# Patient Record
Sex: Female | Born: 1990 | Hispanic: Yes | Marital: Married | Smoking: Never smoker
Health system: Southern US, Community
[De-identification: ages and names within clinical notes are randomized; demographics above are authoritative.]

## PROBLEM LIST (undated history)

## (undated) DIAGNOSIS — K2970 Gastritis, unspecified, without bleeding: Secondary | ICD-10-CM

## (undated) DIAGNOSIS — O139 Gestational [pregnancy-induced] hypertension without significant proteinuria, unspecified trimester: Secondary | ICD-10-CM

## (undated) DIAGNOSIS — K219 Gastro-esophageal reflux disease without esophagitis: Secondary | ICD-10-CM

## (undated) DIAGNOSIS — O24419 Gestational diabetes mellitus in pregnancy, unspecified control: Secondary | ICD-10-CM

## (undated) HISTORY — DX: Gestational diabetes mellitus in pregnancy, unspecified control: O24.419

## (undated) HISTORY — PX: NO PAST SURGERIES: SHX2092

## (undated) HISTORY — DX: Gestational (pregnancy-induced) hypertension without significant proteinuria, unspecified trimester: O13.9

---

## 2018-12-20 ENCOUNTER — Emergency Department (HOSPITAL_COMMUNITY): Payer: Self-pay

## 2018-12-20 ENCOUNTER — Emergency Department (HOSPITAL_COMMUNITY)
Admission: EM | Admit: 2018-12-20 | Discharge: 2018-12-20 | Disposition: A | Discharge status: Home / Self Care | Payer: Self-pay | Attending: Emergency Medicine | Admitting: Emergency Medicine

## 2018-12-20 ENCOUNTER — Encounter (HOSPITAL_COMMUNITY): Payer: Self-pay

## 2018-12-20 ENCOUNTER — Other Ambulatory Visit: Payer: Self-pay

## 2018-12-20 DIAGNOSIS — R1013 Epigastric pain: Secondary | ICD-10-CM | POA: Insufficient documentation

## 2018-12-20 LAB — COMPREHENSIVE METABOLIC PANEL
ALT: 89 U/L — ABNORMAL HIGH (ref 0–44)
AST: 134 U/L — ABNORMAL HIGH (ref 15–41)
Albumin: 3.9 g/dL (ref 3.5–5.0)
Alkaline Phosphatase: 70 U/L (ref 38–126)
Anion gap: 10 (ref 5–15)
BUN: 12 mg/dL (ref 6–20)
CO2: 25 mmol/L (ref 22–32)
Calcium: 9 mg/dL (ref 8.9–10.3)
Chloride: 101 mmol/L (ref 98–111)
Creatinine, Ser: 0.69 mg/dL (ref 0.44–1.00)
GFR calc Af Amer: >60 mL/min (ref >60)
GFR calc non Af Amer: >60 mL/min (ref >60)
Glucose, Bld: 138 mg/dL — ABNORMAL HIGH (ref 70–99)
Potassium: 3.8 mmol/L (ref 3.5–5.1)
Sodium: 136 mmol/L (ref 135–145)
Total Bilirubin: 0.6 mg/dL (ref 0.3–1.2)
Total Protein: 7.7 g/dL (ref 6.5–8.1)

## 2018-12-20 LAB — URINALYSIS, ROUTINE W REFLEX MICROSCOPIC
Bacteria, UA: NONE SEEN
Bilirubin Urine: NEGATIVE
Glucose, UA: NEGATIVE mg/dL
Hgb urine dipstick: NEGATIVE
Ketones, ur: NEGATIVE mg/dL
Leukocytes,Ua: NEGATIVE
Nitrite: NEGATIVE
Protein, ur: 30 mg/dL — AB
Specific Gravity, Urine: 1.026 (ref 1.005–1.030)
pH: 7 (ref 5.0–8.0)

## 2018-12-20 LAB — CBC
HCT: 36.3 % (ref 36.0–46.0)
Hemoglobin: 12.5 g/dL (ref 12.0–15.0)
MCH: 29.8 pg (ref 26.0–34.0)
MCHC: 34.4 g/dL (ref 30.0–36.0)
MCV: 86.4 fL (ref 80.0–100.0)
Platelets: 344 10*3/uL (ref 150–400)
RBC: 4.2 MIL/uL (ref 3.87–5.11)
RDW: 13.2 % (ref 11.5–15.5)
WBC: 13.2 10*3/uL — ABNORMAL HIGH (ref 4.0–10.5)
nRBC: 0 % (ref 0.0–0.2)

## 2018-12-20 LAB — LIPASE, BLOOD: Lipase: 31 U/L (ref 11–51)

## 2018-12-20 LAB — I-STAT BETA HCG BLOOD, ED (MC, WL, AP ONLY): I-stat hCG, quantitative: <5 m[IU]/mL (ref <5)

## 2018-12-20 NOTE — ED Notes (Signed)
Patient Alert and oriented to baseline. Stable and ambulatory to baseline. Patient verbalized understanding of the discharge instructions.  Patient belongings were taken by the patient.   

## 2018-12-20 NOTE — ED Notes (Signed)
Pt is hispanic she does understand english

## 2018-12-20 NOTE — ED Triage Notes (Signed)
Pt endorses abd pain "for a while especially when I eat spicy food" with 1 episode of n/v today. Unknown LMP. VSS

## 2018-12-20 NOTE — ED Provider Notes (Signed)
Care assumed at shift change from venter, PA-C, pending RUQ U/S. See her note for full HPI and workup. Briefly, pt presenting with longstanding hx of epigastric pain, [place]. Pt uses prilosec prn. Burning pain with nausea today. Denies EtOH. Pending RUQ u/s for possible cholecystits vs cholelithiasis. If neg, d/c with symptomatic for GERD and PCP referral.  Physical Exam  BP 120/77   Pulse 80   Temp 98.1 F (36.7 C) (Oral)   Resp 16   LMP 11/18/2018 (Exact Date)   SpO2 100%   Physical Exam Vitals signs and nursing note reviewed.  Constitutional:      General: She is not in acute distress.    Appearance: She is well-developed.  HENT:     Head: Normocephalic and atraumatic.  Eyes:     Conjunctiva/sclera: Conjunctivae normal.  Pulmonary:     Effort: Pulmonary effort is normal.  Neurological:     Mental Status: She is alert.  Psychiatric:        Mood and Affect: Mood normal.        Behavior: Behavior normal.    Results for orders placed or performed during the hospital encounter of 12/20/18  Lipase, blood  Result Value Ref Range   Lipase 31 11 - 51 U/L  Comprehensive metabolic panel  Result Value Ref Range   Sodium 136 135 - 145 mmol/L   Potassium 3.8 3.5 - 5.1 mmol/L   Chloride 101 98 - 111 mmol/L   CO2 25 22 - 32 mmol/L   Glucose, Bld 138 (H) 70 - 99 mg/dL   BUN 12 6 - 20 mg/dL   Creatinine, Ser 9.600.69 0.44 - 1.00 mg/dL   Calcium 9.0 8.9 - 45.410.3 mg/dL   Total Protein 7.7 6.5 - 8.1 g/dL   Albumin 3.9 3.5 - 5.0 g/dL   AST 098134 (H) 15 - 41 U/L   ALT 89 (H) 0 - 44 U/L   Alkaline Phosphatase 70 38 - 126 U/L   Total Bilirubin 0.6 0.3 - 1.2 mg/dL   GFR calc non Af Amer >60 >60 mL/min   GFR calc Af Amer >60 >60 mL/min   Anion gap 10 5 - 15  CBC  Result Value Ref Range   WBC 13.2 (H) 4.0 - 10.5 K/uL   RBC 4.20 3.87 - 5.11 MIL/uL   Hemoglobin 12.5 12.0 - 15.0 g/dL   HCT 11.936.3 14.736.0 - 82.946.0 %   MCV 86.4 80.0 - 100.0 fL   MCH 29.8 26.0 - 34.0 pg   MCHC  34.4 30.0 - 36.0 g/dL   RDW 56.213.2 13.011.5 - 86.515.5 %   Platelets 344 150 - 400 K/uL   nRBC 0.0 0.0 - 0.2 %  Urinalysis, Routine w reflex microscopic  Result Value Ref Range   Color, Urine YELLOW YELLOW   APPearance CLOUDY (A) CLEAR   Specific Gravity, Urine 1.026 1.005 - 1.030   pH 7.0 5.0 - 8.0   Glucose, UA NEGATIVE NEGATIVE mg/dL   Hgb urine dipstick NEGATIVE NEGATIVE   Bilirubin Urine NEGATIVE NEGATIVE   Ketones, ur NEGATIVE NEGATIVE mg/dL   Protein, ur 30 (A) NEGATIVE mg/dL   Nitrite NEGATIVE NEGATIVE   Leukocytes,Ua NEGATIVE NEGATIVE   RBC / HPF 0-5 0 - 5 RBC/hpf   WBC, UA 6-10 0 - 5 WBC/hpf   Bacteria, UA NONE SEEN NONE SEEN   Squamous Epithelial / LPF 0-5 0 - 5   Mucus PRESENT   I-Stat beta hCG blood, ED  Result Value Ref Range   I-stat hCG, quantitative <5.0 <5 mIU/mL   Comment 3           US Abdomen Limited Ruq  Result Date: 12/20/2018 CLINICAL DATA:  Epigastric pain EXAM: ULTRASOUND ABDOMEN LIMITED RIGHT UPPER QUADRANT COMPARISON:  None. FINDINGS: Gallbladder: There is gallbladder sludge. The gallbladder wall is not thickened and measures approximately 2 mm in thickness. There is no pericholecystic free fluid the gallbladder is not significantly distended. Common bile duct: Diameter: 4 mm Liver: No focal lesion identified. Within normal limits in parenchymal echogenicity. Portal vein is patent on color Doppler imaging with normal direction of blood flow towards the liver. Other: None. IMPRESSION: There is gallbladder sludge in the setting of a positive sonographic Murphy sign. As there is no gallbladder wall thickening or pericholecystic free fluid, acute cholecystitis seems unlikely. If there is persistent clinical concern for acute cholecystitis, follow-up with HIDA scan is recommended. Electronically Signed   By: Constance Holster M.D.   On: 12/20/2018 19:02     ED Course/Procedures     Procedures  MDM  Ultrasound with some sludge present though no stones.  There is no  gallbladder wall thickening or fluid suggestive of cholecystitis.  Afebrile.  Discussed patient with Dr. Reather Converse.  As patient is requiring no pain medication today and symptoms have been chronic, no indication for further work-up today in the ED.  Will provide outpatient surgery referral and instructed close follow-up with PCP.  Strict return precautions.  Patient is safe for discharge.       Robinson, [place] N, PA-C 12/20/18 2135    Elnora Morrison, MD 12/21/18 2340

## 2018-12-20 NOTE — Discharge Instructions (Addendum)
It is recommended that you take your Prilosec/omeprazole daily to help with your symptoms. You can also try Tylenol for your symptoms. It is recommended you avoid alcohol, spicy foods, very fatty/greasy foods, NSAIDs such as Advil/ibuprofen/Motrin, Aleve/naproxen, aspirin, as all of these things can worsen your symptoms. Follow close with your primary care provider regarding your visit today. Return the emergency department if you develop fever, or severely worsening pain.  Se recomienda que tome Prilosec / omeprazol todos los das para aliviar sus sntomas. Tambin puede probar Tylenol para sus sntomas. Se recomienda que evite el alcohol, las comidas picantes, las comidas muy grasosas / grasosas, los [employer] Advil / ibuprofeno / Motrin, Aleve / naproxeno, aspirina, ya que todas estas cosas pueden empeorar sus sntomas. Siga de cerca con su proveedor de atencin primaria sobre su visita de hoy. Regrese al departamento de emergencias si presenta fiebre o un dolor que empeora gravemente.

## 2018-12-20 NOTE — ED Provider Notes (Signed)
[facility] EMERGENCY DEPARTMENT Provider Note   CSN: [id number] Arrival date & time: 12/20/18  1531     History   Chief Complaint Chief Complaint  Patient presents with  . Abdominal Pain    HPI Molly Carr is a 28 y.o. female who presents to the ED today complaining of gradual onset, intermittent, epigastric abdominal pain x "forever" worse within the last 6 months. Pt reports she moved from [place] 1 year ago - when she was in her home country she was diagnosed with GERD while pregnant and given Omeprazole to take. She reports this helped her symptoms. When she moved to [place] she found the same medication OTC and has been taking it PRN for her pain. Pt reports that she can typically take it when she has the pain and it will go away. She states she had similar pain last week which was resolved with omeprazole but today she had the pain and it didn't go away with the medication. She also reports 2 episodes of NBNB emesis today which is new for her. No recent suspicious food intake. No recent abx use. Denies EtOH. Denies fever, chills, diarrhea, constipation, coffee ground emesis, hematochezia, melena, urinary sx, pelvic pain, vaginal discharge, or any other associated symptoms.   The history is provided by the patient. The history is limited by a language barrier. A language interpreter was used.         History reviewed. No pertinent past medical history.  There are no active problems to display for this patient.   History reviewed. No pertinent surgical history.   OB History   No obstetric history on file.      Home Medications    Prior to Admission medications   Not on File    Family History History reviewed. No pertinent family history.  Social History Social History   Tobacco Use  . Smoking status: Never Smoker  Substance Use Topics  . Alcohol use: Never    Frequency: Never  . Drug use: Never     Allergies   Patient has  no known allergies.   Review of Systems Review of Systems  Constitutional: Negative for chills and fever.  HENT: Negative for congestion.   Eyes: Negative for visual disturbance.  Respiratory: Negative for cough and shortness of breath.   Cardiovascular: Negative for chest pain.  Gastrointestinal: Positive for abdominal pain, nausea and vomiting. Negative for blood in stool, constipation and diarrhea.  Genitourinary: Negative for difficulty urinating, dysuria, flank pain, hematuria, pelvic pain, vaginal bleeding and vaginal discharge.  Musculoskeletal: Negative for myalgias.  Skin: Negative for rash.  Neurological: Negative for headaches.     Physical Exam Updated Vital Signs BP 120/77   Pulse 80   Temp 98.1 F (36.7 C) (Oral)   Resp 16   LMP 11/18/2018 (Exact Date)   SpO2 100%   Physical Exam Vitals signs and nursing note reviewed.  Constitutional:      Appearance: She is not ill-appearing.  HENT:     Head: Normocephalic and atraumatic.  Eyes:     Conjunctiva/sclera: Conjunctivae normal.  Neck:     Musculoskeletal: Neck supple.  Cardiovascular:     Rate and Rhythm: Normal rate and regular rhythm.     Heart sounds: Normal heart sounds.  Pulmonary:     Effort: Pulmonary effort is normal.     Breath sounds: Normal breath sounds. No wheezing, rhonchi or rales.  Abdominal:     Palpations: Abdomen is soft.  Tenderness: There is abdominal tenderness in the epigastric area. There is no guarding or rebound. Negative signs include Murphy's sign.  Skin:    General: Skin is warm and dry.  Neurological:     Mental Status: She is alert.      ED Treatments / Results  Labs (all labs ordered are listed, but only abnormal results are displayed) Labs Reviewed  COMPREHENSIVE METABOLIC PANEL - Abnormal; Notable for the following components:      Result Value   Glucose, Bld 138 (*)    AST 134 (*)    ALT 89 (*)    All other components within normal limits  CBC -  Abnormal; Notable for the following components:   WBC 13.2 (*)    All other components within normal limits  URINALYSIS, ROUTINE W REFLEX MICROSCOPIC - Abnormal; Notable for the following components:   APPearance CLOUDY (*)    Protein, ur 30 (*)    All other components within normal limits  LIPASE, BLOOD  I-STAT BETA HCG BLOOD, ED ([facility], [facility], [facility] ONLY)    EKG None  Radiology No results found.  Procedures Procedures (including critical care time)  Medications Ordered in ED Medications - No data to display   Initial Impression / Assessment and Plan / ED Course  I have reviewed the triage vital signs and the nursing notes.  Pertinent labs & imaging results that were available during my care of the patient were reviewed by me and considered in my medical decision making (see chart for details).    28-Spanish speaking female presents the ED today complaining of epigastric abdominal pain for years, worse in the last 6 months.  She was diagnosed with GERD while she was pregnant in [place] and was taking omeprazole.  She states she is only taking this as needed.  Took it today without relief.  Has had 2 episodes of emesis today which is new for patient.  Endorses a burning sensation to her chest and her throat.  This sounds very similar to GERD.  Baseline blood work was obtained prior to being seen.  Patient does have an elevated white blood cell count of 13,000 today.  Do not have a baseline to compare.  No reason for chronic elevation including chronic prednisone use.  Is afebrile today without tachycardia or tachypnea.  Question if this is related to infection versus pain.  She also has elevation in her AST and ALT consistent with alcohol use.  Patient is denying any alcohol use for religious reasons.  Elevation in alk phos or T bili today.  Given epigastric pain and elevation in LFTs will obtain right upper quadrant ultrasound to rule out abnormalities with liver and gallbladder.  Patient  reports she is up-to-date on her vaccines including hepatitis A and B. Elevation not consistent with viral hepatitis.   RUQ ultrasound does show gallbladder sludge without signs of acute cholecystitis. Radiologist recommended HIDA scan if concern for acute cholecystitis.   7:22 PM At shift change case signed out to [place] Robinson, PA-C, who will dispo patient accordingly. Given no signs of acute cholecystitis today this may be chronic in nature and can be followed up outpatient.       Final Clinical Impressions(s) / ED Diagnoses   Final diagnoses:  Epigastric abdominal pain    ED Discharge Orders    None       Eustaquio Maize, Hershal Coria 12/20/18 Doran Heater    Quintella Reichert, MD 12/22/18 1324

## 2019-04-03 NOTE — L&D Delivery Note (Signed)
Patient: Molly Carr MRN: [id number]  GBS status: Positive, IAP given- PCN inadequately treated  Patient is a 29 y.o. now G2P2002 s/p NSVD at [redacted]w[redacted]d, who was admitted for SOL. SROM 8h 66m prior to delivery with clear fluid.    Delivery Note At 7:43 PM a viable female was delivered via Vaginal, Spontaneous (Presentation: Direct Occiput Anterior).  APGAR & weight see delivery summary.   Placenta status: Spontaneous;Pathology, Intact. Cord: 3 vessels with the following complications: None. Cord pH: gases not collected  Head delivered direct OA. No nuchal cord present. Shoulder and body delivered in usual fashion. Infant with spontaneous cry, placed on mother's abdomen, dried and bulb suctioned. Cord clamped x 2 after 1-minute delay, and cut by family member. Cord blood drawn. Placenta delivered spontaneously with gentle cord traction. Fundus firm with massage and Pitocin. Perineum inspected and found to have a right periurethral laceration, which was found to be hemostatic.  Anesthesia: Epidural Episiotomy: None Lacerations: 1st degree;Periurethral Suture Repair: n/a Est. Blood Loss (mL): 125  Mom to postpartum.  Baby to Couplet care / Skin to Skin.  Trellis Guirguis Autry-Lott 02/28/2020, 8:02 PM

## 2019-07-12 ENCOUNTER — Inpatient Hospital Stay (HOSPITAL_COMMUNITY): Payer: Self-pay

## 2019-07-12 ENCOUNTER — Inpatient Hospital Stay (HOSPITAL_COMMUNITY)
Admission: AD | Admit: 2019-07-12 | Discharge: 2019-07-12 | Disposition: A | Discharge status: Home / Self Care | Payer: Self-pay | Attending: Obstetrics & Gynecology | Admitting: Obstetrics & Gynecology

## 2019-07-12 ENCOUNTER — Other Ambulatory Visit: Payer: Self-pay

## 2019-07-12 ENCOUNTER — Encounter (HOSPITAL_COMMUNITY): Payer: Self-pay | Admitting: Emergency Medicine

## 2019-07-12 DIAGNOSIS — O4690 Antepartum hemorrhage, unspecified, unspecified trimester: Secondary | ICD-10-CM

## 2019-07-12 DIAGNOSIS — Z3A01 Less than 8 weeks gestation of pregnancy: Secondary | ICD-10-CM | POA: Insufficient documentation

## 2019-07-12 DIAGNOSIS — O208 Other hemorrhage in early pregnancy: Secondary | ICD-10-CM | POA: Insufficient documentation

## 2019-07-12 DIAGNOSIS — O418X10 Other specified disorders of amniotic fluid and membranes, first trimester, not applicable or unspecified: Secondary | ICD-10-CM

## 2019-07-12 DIAGNOSIS — Z6790 Unspecified blood type, Rh positive: Secondary | ICD-10-CM

## 2019-07-12 DIAGNOSIS — W010XXA Fall on same level from slipping, tripping and stumbling without subsequent striking against object, initial encounter: Secondary | ICD-10-CM | POA: Insufficient documentation

## 2019-07-12 HISTORY — DX: Gastro-esophageal reflux disease without esophagitis: K21.9

## 2019-07-12 HISTORY — DX: Gastritis, unspecified, without bleeding: K29.70

## 2019-07-12 LAB — URINALYSIS, ROUTINE W REFLEX MICROSCOPIC
Bilirubin Urine: NEGATIVE
Glucose, UA: NEGATIVE mg/dL
Ketones, ur: NEGATIVE mg/dL
Leukocytes,Ua: NEGATIVE
Nitrite: NEGATIVE
Protein, ur: NEGATIVE mg/dL
Specific Gravity, Urine: 1.02 (ref 1.005–1.030)
pH: 6 (ref 5.0–8.0)

## 2019-07-12 LAB — WET PREP, GENITAL
Sperm: NONE SEEN
Trich, Wet Prep: NONE SEEN
Yeast Wet Prep HPF POC: NONE SEEN

## 2019-07-12 LAB — HCG, QUANTITATIVE, PREGNANCY: hCG, Beta Chain, Quant, S: 20112 m[IU]/mL — ABNORMAL HIGH (ref <5)

## 2019-07-12 LAB — CBC
HCT: 36.2 % (ref 36.0–46.0)
Hemoglobin: 12.1 g/dL (ref 12.0–15.0)
MCH: 28.8 pg (ref 26.0–34.0)
MCHC: 33.4 g/dL (ref 30.0–36.0)
MCV: 86.2 fL (ref 80.0–100.0)
Platelets: 344 10*3/uL (ref 150–400)
RBC: 4.2 MIL/uL (ref 3.87–5.11)
RDW: 13.2 % (ref 11.5–15.5)
WBC: 9.7 10*3/uL (ref 4.0–10.5)
nRBC: 0 % (ref 0.0–0.2)

## 2019-07-12 LAB — ABO/RH: ABO/RH(D): O POS

## 2019-07-12 LAB — URINALYSIS, MICROSCOPIC (REFLEX): WBC, UA: NONE SEEN WBC/hpf (ref 0–5)

## 2019-07-12 NOTE — MAU Note (Signed)
Larey Seat today- tripped, hit her right knee.  Then felt pain in lower back, went to bathroom after shopping and saw blood when wiped. Then later, saw dark brown. No more since this time.  Has BHCG paper from [facility] from 06-29-19 with quant of 237.

## 2019-07-12 NOTE — MAU Provider Note (Signed)
History     CSN: [id number]  Arrival date and time: 07/12/19 1940   First Provider Initiated Contact with Patient 07/12/19 2153      Chief Complaint  Patient presents with  . Fall  . Vaginal Bleeding   29 y.o. G2P1001 @[redacted]w[redacted]d  by sure LMP presenting with spotting after a fall. Reports tripping and falling up stairs this afternoon. She landed on her right knee. No head trauma. No LOC. Later this evening she saw blood when she went to the BR. Bleeding has not continued. She reports previously having intermittent abdominal pain but pain has become more frequent and cramping since fall. Rates pain 8/10. Has not taken anything for it.   OB History    Gravida  2   Para  1   Term  1   Preterm      AB      Living  1     SAB      TAB      Ectopic      Multiple      Live Births              Past Medical History:  Diagnosis Date  . Acid reflux   . Gastritis     History reviewed. No pertinent surgical history.  No family history on file.  Social History   Tobacco Use  . Smoking status: Never Smoker  . Smokeless tobacco: Never Used  Substance Use Topics  . Alcohol use: Never  . Drug use: Never    Allergies: No Known Allergies  No medications prior to admission.    Review of Systems  Gastrointestinal: Positive for abdominal pain.  Genitourinary: Positive for vaginal bleeding.  Musculoskeletal: Positive for back pain.   Physical Exam   Blood pressure 129/66, pulse 78, temperature 98.2 F (36.8 C), temperature source Oral, resp. rate 16, height 5\' 6"  (1.676 m), weight 81.2 kg, last menstrual period 11/18/2018, SpO2 97 %.  Physical Exam  Nursing note and vitals reviewed. Constitutional: She is oriented to person, place, and time. She appears well-developed and well-nourished. No distress.  HENT:  Head: Normocephalic and atraumatic.  Cardiovascular: Normal rate.  Respiratory: Effort normal. No respiratory distress.  GI: Soft. She exhibits no  distension and no mass. There is no abdominal tenderness. There is no rebound and no guarding.  Genitourinary:    Genitourinary Comments: External: no lesions or erythema Vagina: rugated, pink, moist, no discharge or blood Uterus: non enlarged, anteverted, non tender, no CMT Adnexae: no masses, no tenderness left, no tenderness right Cervix closed    Musculoskeletal:        General: Normal range of motion.     Cervical back: Normal range of motion.  Neurological: She is alert and oriented to person, place, and time.  Skin: Skin is warm and dry.  Psychiatric: She has a normal mood and affect.   Results for orders placed or performed during the hospital encounter of 07/12/19 (from the past 24 hour(s))  Wet prep, genital     Status: Abnormal   Collection Time: 07/12/19  9:47 PM   Specimen: PATH Cytology Cervicovaginal Ancillary Only  Result Value Ref Range   Yeast Wet Prep HPF POC NONE SEEN NONE SEEN   Trich, Wet Prep NONE SEEN NONE SEEN   Clue Cells Wet Prep HPF POC PRESENT (A) NONE SEEN   WBC, Wet Prep HPF POC FEW (A) NONE SEEN   Sperm NONE SEEN   Urinalysis, Routine w reflex microscopic  Status: Abnormal   Collection Time: 07/12/19  9:51 PM  Result Value Ref Range   Color, Urine YELLOW YELLOW   APPearance CLEAR CLEAR   Specific Gravity, Urine 1.020 1.005 - 1.030   pH 6.0 5.0 - 8.0   Glucose, UA NEGATIVE NEGATIVE mg/dL   Hgb urine dipstick MODERATE (A) NEGATIVE   Bilirubin Urine NEGATIVE NEGATIVE   Ketones, ur NEGATIVE NEGATIVE mg/dL   Protein, ur NEGATIVE NEGATIVE mg/dL   Nitrite NEGATIVE NEGATIVE   Leukocytes,Ua NEGATIVE NEGATIVE  Urinalysis, Microscopic (reflex)     Status: Abnormal   Collection Time: 07/12/19  9:51 PM  Result Value Ref Range   RBC / HPF 0-5 0 - 5 RBC/hpf   WBC, UA NONE SEEN 0 - 5 WBC/hpf   Bacteria, UA RARE (A) NONE SEEN   Squamous Epithelial / LPF 0-5 0 - 5  ABO/Rh     Status: None   Collection Time: 07/12/19 10:04 PM  Result Value Ref Range    ABO/RH(D) O POS    No rh immune globuloin      NOT A RH IMMUNE GLOBULIN CANDIDATE, PT RH POSITIVE Performed at [facility], [address] [place], [place] [address]   CBC     Status: None   Collection Time: 07/12/19 10:04 PM  Result Value Ref Range   WBC 9.7 4.0 - 10.5 K/uL   RBC 4.20 3.87 - 5.11 MIL/uL   Hemoglobin 12.1 12.0 - 15.0 g/dL   HCT 36.2 36.0 - 46.0 %   MCV 86.2 80.0 - 100.0 fL   MCH 28.8 26.0 - 34.0 pg   MCHC 33.4 30.0 - 36.0 g/dL   RDW 13.2 11.5 - 15.5 %   Platelets 344 150 - 400 K/uL   nRBC 0.0 0.0 - 0.2 %  hCG, quantitative, pregnancy     Status: Abnormal   Collection Time: 07/12/19 10:04 PM  Result Value Ref Range   hCG, Beta Chain, Quant, S 20,112 (H) <5 mIU/mL   US OB LESS THAN 14 WEEKS WITH OB TRANSVAGINAL  Result Date: 07/12/2019 CLINICAL DATA:  Pregnant, fall, bleeding EXAM: OBSTETRIC <14 WK [place] AND TRANSVAGINAL OB US TECHNIQUE: Both transabdominal and transvaginal ultrasound examinations were performed for complete evaluation of the gestation as well as the maternal uterus, adnexal regions, and pelvic cul-de-sac. Transvaginal technique was performed to assess early pregnancy. COMPARISON:  None. FINDINGS: Intrauterine gestational sac: Single Yolk sac:  Visualized. Embryo:  Visualized. Cardiac Activity: Visualized. Heart Rate: 102 bpm CRL:  2.8 mm   5 w   5 d                  [place] EDC: 03/08/2020 Subchorionic hemorrhage:  Small subchronic hemorrhage. Maternal uterus/adnexae: Bilateral ovaries are within normal limits, noting a right corpus luteum. Small anterior fibroid in the lower uterine segment measuring 1.4 x 1.0 x 1.6 cm. No free fluid. IMPRESSION: Single live intrauterine gestation, with estimated gestational age [redacted] weeks 5 days by crown-rump length, as above. Electronically Signed   By: Julian Hy M.D.   On: 07/12/2019 22:54   MAU Course  Procedures  MDM Declines analgesic. Labs and [place] ordered and reviewed. Viable IUP on [place] with small Hiram.  Discussed results with pt. Stable for discharge home.  Assessment and Plan   1. [redacted] weeks gestation of pregnancy   2. Vaginal bleeding in pregnancy   3. Subchorionic hematoma in first trimester, single or unspecified fetus   4. Blood type, Rh positive  Discharge home Follow up at [facility] as scheduled SAB precautions Pelvic rest  Allergies as of 07/12/2019   No Known Allergies     Medication List    You have not been prescribed any medications.    Live and video interpreter present for encounter  Donette Larry 07/12/2019, 11:32 PM

## 2019-07-12 NOTE — ED Provider Notes (Signed)
Patient placed in Quick Look pathway, seen and evaluated   Spanish video interpreter used during visit  Chief Complaint: Spotting  HPI: Patient reports that she is [redacted] weeks pregnant at this time has her first OB/GYN appointment scheduled for early May.  She reports that she was walking up the stairs at her home today when her foot slipped and she landed on her right knee.  Patient reports that she had only minimal pain of the right knee initially after she fell, this is gradually improved and now she is unconcerned about her right knee pain.  She denies any other injuries.  Patient reports that when she used the bathroom approximately 1 hour ago she was wiping and noticed some pink spots on the toilet paper, this concerned her and she came to the ER.  ROS: Positive vaginal spotting Negative head injury, loss of consciousness, headache, neck pain, back pain, chest pain, abdominal pain, nausea/vomiting, numbness/tingling, weakness, joint swelling, dysuria or any additional concerns  Physical Exam:   Gen: No distress  Neuro: Awake and Alert  Skin: Warm    Focused Exam: Abdomen soft nontender without peritoneal signs.  C/T/L spine nontender no crepitus step-off or deformity.  Full range of motion and appropriate strength at the right knee, hip and ankle without pain.  Neurovascular intact to the foot no significant swelling or deformity.  8:59 PM: Spoke with MAU provider Melanie who accepts patient in transfer.  Initiation of care has begun. The patient has been counseled on the process, plan, and necessity for staying for the completion/evaluation, and the remainder of the medical screening examination   Elizabeth Palau 07/12/19 2112    Little, Ambrose Finland, MD 07/15/19 1454

## 2019-07-12 NOTE — ED Triage Notes (Signed)
Pt is Spanish speaking. Interpreter used in triage. Pt reports she tripped and fell and landed on her right knee. Pt denies any other injuries. Pt reports she is now having lower back sharp pain and pain in her womb. Pt is [redacted] weeks pregnant. Pt reports she is having spotting now.

## 2019-07-12 NOTE — Discharge Instructions (Signed)
Hematoma subcoriónico °Subchorionic Hematoma ° °Un hematoma subcoriónico es una acumulación de sangre entre la pared externa del embrión (corion) y la pared interna de la matriz (útero). °Esta afección puede causar hemorragia vaginal. Si causan poca o nada de hemorragia vaginal, generalmente, los hematomas pequeños que ocurren al principio del embarazo se reducen por su propia cuenta y no afectan al bebé ni al embarazo. Cuando la hemorragia comienza más tarde en el embarazo, o el hematoma es más grande o se produce en una paciente de edad avanzada, la afección puede ser más grave. Los hematomas más grandes pueden agrandarse aún más, lo que aumenta las posibilidades de aborto espontáneo. Esta afección también aumenta los siguientes riesgos: °· Separación prematura de la placenta del útero. °· Parto antes de término (prematuro). °· Muerte fetal. °¿Cuáles son las causas? °Se desconoce la causa exacta de esta afección. Ocurre cuando la sangre queda atrapada entre la placenta y la pared uterina porque la placenta se ha separado del lugar original del implante. °¿Qué incrementa el riesgo? °Es más probable que desarrolle esta afección si: °· Recibió tratamiento con medicamentos para la fertilidad. °· La concepción se realizó a través de la fertilización in vitro (FIV). °¿Cuáles son los signos o los síntomas? °Los síntomas de esta afección incluyen los siguientes: °· Pérdida o hemorragia vaginal. °· Contracciones del útero. Estas contracciones provocan dolor abdominal. °En ocasiones, puede no haber síntomas y la hemorragia solo se puede ver cuando se toman imágenes ecográficas (ecografía transvaginal). °¿Cómo se diagnostica? °Esta afección se diagnostica con un examen físico. Es un examen pélvico. También pueden hacerle otros estudios, por ejemplo: °· Análisis de sangre. °· Análisis de orina. °· Ecografía del abdomen. °¿Cómo se trata? °El tratamiento de esta afección puede variar. El tratamiento puede incluir lo  siguiente: °· Observación cautelosa. La observarán atentamente para detectar cualquier cambio en la hemorragia. Durante esta etapa: °? El hematoma puede reabsorberse en el cuerpo. °? El hematoma puede separar el espacio lleno de líquido que contiene al embrión (saco gestacional) de la pared del útero (endometrio). °· Medicamentos. °· Restricción de las actividades. Puede ser necesaria hasta que se detenga la hemorragia. °Siga estas indicaciones en su casa: °· Haga reposo en cama si se lo indica el médico. °· No levante ningún objeto que pese más de 10 libras (4,5 kg) o siga las indicaciones del médico. °· No consuma ningún producto que contenga nicotina o tabaco, como cigarrillos y cigarrillos electrónicos. Si necesita ayuda para dejar de fumar, consulte al médico. °· Lleve un registro escrito de la cantidad de toallas higiénicas que utiliza cada día y cuán empapadas (saturadas) están. °· No use tampones. °· Concurra a todas las visitas de control como se lo haya indicado el médico. Esto es importante. El profesional podrá pedirle que se realice análisis de seguimiento, ecografías o ambas. °Comuníquese con un médico si: °· Tiene una hemorragia vaginal. °· Tiene fiebre. °Solicite ayuda de inmediato si: °· Siente calambres intensos en el estómago, en la espalda, en el abdomen o en la pelvis. °· Elimina coágulos o tejidos grandes. Guarde los tejidos para que su médico los vea. °· Tiene más hemorragia vaginal, y se desmaya o se siente mareada o débil. °Resumen °· Un hematoma subcoriónico es una acumulación de sangre entre la pared externa de la placenta y el útero. °· Esta afección puede causar hemorragia vaginal. °· En ocasiones, puede no haber síntomas y la hemorragia solo se puede ver cuando se toman imágenes ecográficas. °· El tratamiento puede incluir una observación cautelosa, medicamentos   o restricción de las actividades. °Esta información no tiene como fin reemplazar el consejo del médico. Asegúrese de hacerle  al médico cualquier pregunta que tenga. °Document Revised: 12/27/2016 Document Reviewed: 12/27/2016 °Elsevier Patient Education © 2020 Elsevier Inc. ° °

## 2019-07-13 LAB — GC/CHLAMYDIA PROBE AMP (~~LOC~~) NOT AT ARMC
Chlamydia: NEGATIVE
Comment: NEGATIVE
Comment: NORMAL
Neisseria Gonorrhea: NEGATIVE

## 2019-07-31 ENCOUNTER — Ambulatory Visit: Payer: Self-pay | Admitting: *Deleted

## 2019-07-31 DIAGNOSIS — Z3480 Encounter for supervision of other normal pregnancy, unspecified trimester: Secondary | ICD-10-CM | POA: Insufficient documentation

## 2019-07-31 NOTE — Progress Notes (Signed)
Patient seen and assessed by nursing staff.  Agree with documentation and plan.  

## 2019-07-31 NOTE — Progress Notes (Signed)
interpreter ID# [id number]  I connected with  Molly Carr on 07/31/19 by a video enabled telemedicine application and verified that I am speaking with the correct person using two identifiers.   I discussed the limitations of evaluation and management by telemedicine. The patient expressed understanding and agreed to proceed.   PRENATAL INTAKE SUMMARY  Ms. Molly Carr presents today New OB Nurse Interview.  OB History    Gravida  2   Para  1   Term  1   Preterm      AB      Living  1     SAB      TAB      Ectopic      Multiple      Live Births             I have reviewed the patient's medical, obstetrical, social, and family histories, medications, and available lab results.  SUBJECTIVE She complains of nausea with vomiting, allergies and small amount of spotting.   OBJECTIVE Initial Physical Exam (New OB)- No exam due to encounter type  GENERAL APPEARANCE: sounds well via phone call.   ASSESSMENT Normal pregnancy SCH noted on last u/s. Pt able to manage N&V  PLAN Prenatal care- [facility] Advised of OTC meds for allergies Advised if any increase in bleeding be seen at hospital

## 2019-08-07 ENCOUNTER — Inpatient Hospital Stay (HOSPITAL_COMMUNITY)
Admission: AD | Admit: 2019-08-07 | Discharge: 2019-08-07 | Disposition: A | Discharge status: Home / Self Care | Payer: Self-pay | Attending: Obstetrics & Gynecology | Admitting: Obstetrics & Gynecology

## 2019-08-07 ENCOUNTER — Encounter: Payer: Self-pay | Admitting: Family Medicine

## 2019-08-07 ENCOUNTER — Other Ambulatory Visit: Payer: Self-pay

## 2019-08-07 ENCOUNTER — Encounter (HOSPITAL_COMMUNITY): Payer: Self-pay | Admitting: Obstetrics & Gynecology

## 2019-08-07 DIAGNOSIS — K219 Gastro-esophageal reflux disease without esophagitis: Secondary | ICD-10-CM | POA: Insufficient documentation

## 2019-08-07 DIAGNOSIS — Z3A09 9 weeks gestation of pregnancy: Secondary | ICD-10-CM | POA: Insufficient documentation

## 2019-08-07 DIAGNOSIS — Z79899 Other long term (current) drug therapy: Secondary | ICD-10-CM | POA: Insufficient documentation

## 2019-08-07 DIAGNOSIS — R1010 Upper abdominal pain, unspecified: Secondary | ICD-10-CM | POA: Insufficient documentation

## 2019-08-07 DIAGNOSIS — Z8719 Personal history of other diseases of the digestive system: Secondary | ICD-10-CM | POA: Insufficient documentation

## 2019-08-07 DIAGNOSIS — O26891 Other specified pregnancy related conditions, first trimester: Secondary | ICD-10-CM | POA: Insufficient documentation

## 2019-08-07 DIAGNOSIS — O99611 Diseases of the digestive system complicating pregnancy, first trimester: Secondary | ICD-10-CM | POA: Insufficient documentation

## 2019-08-07 DIAGNOSIS — O219 Vomiting of pregnancy, unspecified: Secondary | ICD-10-CM | POA: Insufficient documentation

## 2019-08-07 LAB — URINALYSIS, ROUTINE W REFLEX MICROSCOPIC
Bilirubin Urine: NEGATIVE
Glucose, UA: NEGATIVE mg/dL
Hgb urine dipstick: NEGATIVE
Ketones, ur: NEGATIVE mg/dL
Leukocytes,Ua: NEGATIVE
Nitrite: NEGATIVE
Protein, ur: 100 mg/dL — AB
Specific Gravity, Urine: 1.032 — ABNORMAL HIGH (ref 1.005–1.030)
pH: 5 (ref 5.0–8.0)

## 2019-08-07 MED ORDER — DOXYLAMINE SUCCINATE (SLEEP) 25 MG PO TABS: 25.0000 mg | ORAL_TABLET | Freq: Three times a day (TID) | ORAL | 0 refills | Status: DC | PRN

## 2019-08-07 MED ORDER — PYRIDOXINE HCL 25 MG PO TABS: 25.0000 mg | ORAL_TABLET | Freq: Three times a day (TID) | ORAL | 0 refills | Status: DC

## 2019-08-07 MED ORDER — PROMETHAZINE HCL 25 MG PO TABS: 25.0000 mg | ORAL_TABLET | Freq: Four times a day (QID) | ORAL | 0 refills | Status: DC | PRN

## 2019-08-07 MED ORDER — ONDANSETRON 4 MG PO TBDP
4.0000 mg | ORAL_TABLET | Freq: Once | ORAL | Status: AC
  Administered 2019-08-07: 4 mg via ORAL
  Filled 2019-08-07: qty 1

## 2019-08-07 MED ORDER — FAMOTIDINE 20 MG PO TABS
10.0000 mg | ORAL_TABLET | Freq: Once | ORAL | Status: AC
  Administered 2019-08-07: 10 mg via ORAL
  Filled 2019-08-07: qty 1

## 2019-08-07 NOTE — MAU Provider Note (Signed)
History     CSN: [id number]  Arrival date and time: 08/07/19 3810   First Provider Initiated Contact with Patient 08/07/19 2144      No chief complaint on file.  HPI Molly Carr is a 29 y.o. G2P1001 at [redacted]w[redacted]d who presents to MAU with chief complaint of nausea, vomiting and upper abdominal pain. Her nausea and vomiting began Saturday 08/01/2019 and only occurred in the morning. Today she has been vomiting all day and unable to tolerate anything but sips of water. Her abdominal pain is located across her upper abdomen, triggered by episodes of vomiting and the periods immediately after vomiting. She states she has a medication program and was able to get a prescription for "something for allergies and vomiting" but is unable to provide the name. She denies lower abdominal pain, vaginal bleeding, abdominal tenderness, dysuria, fever or recent illness.   OB History    Gravida  2   Para  1   Term  1   Preterm      AB      Living  1     SAB      TAB      Ectopic      Multiple      Live Births              Past Medical History:  Diagnosis Date  . Acid reflux   . Gastritis     History reviewed. No pertinent surgical history.  Family History  Problem Relation Age of Onset  . Diabetes Mother   . Hypertension Mother   . Heart disease Father   . Heart disease Maternal Grandmother     Social History   Tobacco Use  . Smoking status: Never Smoker  . Smokeless tobacco: Never Used  Substance Use Topics  . Alcohol use: Never  . Drug use: Never    Allergies: No Known Allergies  Medications Prior to Admission  Medication Sig Dispense Refill Last Dose  . omeprazole (PRILOSEC) 10 MG capsule Take 10 mg by mouth daily.   08/07/2019 at Unknown time  . Prenatal Vit-Fe Fumarate-FA (MULTIVITAMIN-PRENATAL) 27-0.8 MG TABS tablet Take 1 tablet by mouth daily at 12 noon.   08/07/2019 at Unknown time    Review of Systems  Constitutional: Negative for  fever.  Gastrointestinal: Positive for abdominal pain, nausea and vomiting.  Genitourinary: Negative for dysuria, flank pain and vaginal bleeding.  All other systems reviewed and are negative.  Physical Exam   Blood pressure 116/68, pulse 86, temperature 99.4 F (37.4 C), temperature source Oral, resp. rate 17, last menstrual period 11/18/2018.  Physical Exam  Nursing note and vitals reviewed. Constitutional: She is oriented to person, place, and time. She appears well-developed and well-nourished.  Cardiovascular: Normal rate and normal heart sounds.  Respiratory: Effort normal and breath sounds normal.  GI: Soft. Bowel sounds are normal. She exhibits no distension. There is no abdominal tenderness. There is no rebound, no guarding and no CVA tenderness.  Genitourinary:    Genitourinary Comments: Not evaluated based on chief complaint   Musculoskeletal:        General: Normal range of motion.  Neurological: She is alert and oriented to person, place, and time.  Skin: Skin is warm and dry.  Psychiatric: She has a normal mood and affect. Her behavior is normal. Judgment and thought content normal.    MAU Course/MDM  Procedures  Patient Vitals for the past 24 hrs:  BP Temp Temp src  Pulse Resp SpO2  08/07/19 2229 111/61 98.3 F (36.8 C) Oral 78 18 100 %  08/07/19 2014 116/68 99.4 F (37.4 C) Oral 86 17 --   Results for orders placed or performed during the hospital encounter of 08/07/19 (from the past 24 hour(s))  Urinalysis, Routine w reflex microscopic     Status: Abnormal   Collection Time: 08/07/19  8:37 PM  Result Value Ref Range   Color, Urine AMBER (A) YELLOW   APPearance CLOUDY (A) CLEAR   Specific Gravity, Urine 1.032 (H) 1.005 - 1.030   pH 5.0 5.0 - 8.0   Glucose, UA NEGATIVE NEGATIVE mg/dL   Hgb urine dipstick NEGATIVE NEGATIVE   Bilirubin Urine NEGATIVE NEGATIVE   Ketones, ur NEGATIVE NEGATIVE mg/dL   Protein, ur 299 (A) NEGATIVE mg/dL   Nitrite NEGATIVE  NEGATIVE   Leukocytes,Ua NEGATIVE NEGATIVE   RBC / HPF 0-5 0 - 5 RBC/hpf   WBC, UA 0-5 0 - 5 WBC/hpf   Bacteria, UA FEW (A) NONE SEEN   Squamous Epithelial / LPF 11-20 0 - 5   Mucus PRESENT    Ca Oxalate Crys, UA PRESENT    Meds ordered this encounter  Medications  . ondansetron (ZOFRAN-ODT) disintegrating tablet 4 mg  . famotidine (PEPCID) tablet 10 mg  . pyridOXINE (VITAMIN B-6) 25 MG tablet    Sig: Take 1 tablet (25 mg total) by mouth every 8 (eight) hours.    Dispense:  30 tablet    Refill:  0    Order Specific Question:   Supervising Provider    Answer:   Despina Hidden, LUTHER H [[id number]]  . doxylamine, Sleep, (UNISOM) 25 MG tablet    Sig: Take 1 tablet (25 mg total) by mouth every 8 (eight) hours as needed.    Dispense:  30 tablet    Refill:  0    Order Specific Question:   Supervising Provider    Answer:   Despina Hidden, LUTHER H [[id number]]  . promethazine (PHENERGAN) 25 MG tablet    Sig: Take 1 tablet (25 mg total) by mouth every 6 (six) hours as needed for nausea or vomiting. For vomiting that does not resolve with B6 and Unisom    Dispense:  30 tablet    Refill:  0    Order Specific Question:   Supervising Provider    Answer:   Lazaro Arms [[id number]]   Assessment and Plan  --29 y.o. G2P1001 at [redacted]w[redacted]d  --IUP confirmed 07/12/2019 --Nausea and vomiting in first trimester, GERD --Bland diet as complement to antiemetics --Language barrier: iPad interpreter services utilized for all patient interaction --Discharge home in stable condition with precautions  Calvert Cantor, CNM 08/08/2019, 2:21 AM

## 2019-08-07 NOTE — MAU Note (Signed)
. .  Molly Carr is a 29 y.o. at [redacted]w[redacted]d here in MAU reporting: that when she eats and drinks she is vomiting. Pt reports gastritis and it makes it worse. Pt reports pain in her upper mid abdomen that is a 8/10. Pt denies vaginal bleeding and discharge. Pt reports her muscles hurt when she vomits in her abdomen.   Pain score: 8/10 Vitals:   08/07/19 2014  BP: 116/68  Pulse: 86  Resp: 17  Temp: 99.4 F (37.4 C)      Lab orders placed from triage:  urinalysis

## 2019-08-07 NOTE — Discharge Instructions (Signed)
Opciones de alimentos para pacientes adultos con enfermedad de reflujo gastroesofgico Food Choices for Gastroesophageal Reflux Disease, Adult Si tiene enfermedad de reflujo gastroesofgico (ERGE), los alimentos que consume y los hbitos de alimentacin son muy importantes. Elegir los alimentos adecuados puede ayudar a aliviar las molestias ocasionadas por la ERGE. Considere la posibilidad de trabajar con un especialista en dieta y nutricin (nutricionista) que lo ayude a hacer elecciones saludables. Qu pautas generales debo seguir?  Plan de alimentacin  Elija alimentos saludables con bajo contenido de grasa, como frutas, verduras, cereales integrales, productos lcteos descremados, carne magra de vaca, de pescado y de ave.  Haga comidas pequeas con frecuencia en lugar de tres comidas abundantes al da. Coma lentamente, en un ambiente distendido. Evite agacharse o recostarse hasta despus de 2 o 3horas de haber comido.  Limite los alimentos con alto contenido graso como las carnes grasas o los alimentos fritos.  Limite el consumo de aceites, manteca y margarina a menos de 8 cucharaditas al da.  Evite lo siguiente: ? Consumir alimentos que le ocasionen sntomas. Pueden ser distintos para cada persona. Lleve un registro de los alimentos para identificar aquellos que le ocasionen sntomas. ? Consumir alcohol. ? Beber grandes cantidades de lquido con las comidas. ? Comer 2 o 3 horas antes de acostarse.  Cocine los alimentos utilizando mtodos que no sean la fritura. Esto puede incluir hornear, emparrillar y hervir. Estilo de vida  Mantenga un peso saludable. Pregunte a su mdico cul es el peso saludable para usted. Si debe perder peso, hable con su mdico para hacerlo de manera segura.  Realice actividad fsica durante, al menos, 30 minutos 5 das por semana o ms, o segn lo indicado por su mdico.  Evite usar ropa ajustada alrededor de la cintura y el pecho.  No consuma ningn  producto que contenga nicotina o tabaco, como cigarrillos y cigarrillos electrnicos. Si necesita ayuda para dejar de fumar, consulte al mdico.  Duerma con la cabecera de la cama elevada. Use una cua debajo del colchn o bloques debajo del armazn de la cama para mantener la cabecera de la cama elevada. Qu alimentos no se recomiendan? Esta podra no ser una lista completa. Hable con el nutricionista sobre las mejores opciones alimenticias para usted. Carbohidratos Pasteles o panes sin levadura con grasa agregada. Tostadas francesas. Verduras Verduras fritas en abundante aceite. Papas fritas. Cualquier verdura que est preparada con grasa agregada. Cualquier verdura que le ocasione sntomas. Para algunas personas, estas pueden incluir tomates y productos con tomate, ajes, cebollas y ajo, y rbanos picantes. Frutas Cualquier fruta que est preparada con grasa agregada. Cualquier fruta que le ocasione sntomas. Para algunas personas, estas pueden incluir, las frutas ctricas como naranja, pomelo, pia y limn. Carnes y otros alimentos ricos en protenas Carnes de alto contenido graso como carne grasa de vaca o cerdo, salchichas, costillas, jamn, salchicha, salame y tocino. Carnes o protenas fritas, lo que incluye pescado frito y pollo frito. Nueces y mantequillas de frutos secos. Lcteos Leche entera y leche con chocolate. Crema cida. Crema. Helados. Queso crema. Batidos con leche. Bebidas Caf y t negro, con o sin cafena. Bebidas carbonatadas. Refrescos. Bebidas energizantes. Jugo de fruta hecho con frutas cidas (como naranja o pomelo). Jugo de tomate. Bebidas alcohlicas. Grasas y aceites Mantequilla. Margarina. Lardo. Mantequilla clarificada. Dulces y postres Chocolate y cacao. Rosquillas. Condimentos y otros alimentos Pimienta. Menta y mentol. Cualquier condimento, hierbas o aderezos que le ocasionen sntomas. Para algunas personas, esto puede incluir curry, salsa picante o aderezos    para ensalada a base de vinagre. Resumen  Si tiene enfermedad de reflujo gastroesofgico (ERGE), las elecciones de alimentos y estilo de vida son muy importantes para ayudar a aliviar las molestias de la ERGE.  Haga comidas pequeas con frecuencia en lugar de tres comidas abundantes al da. Coma lentamente, en un ambiente distendido. Evite agacharse o recostarse hasta despus de 2 o 3horas de haber comido.  Limite los alimentos con alto contenido graso como la carne grasa o los alimentos fritos. Esta informacin no tiene como fin reemplazar el consejo del mdico. Asegrese de hacerle al mdico cualquier pregunta que tenga. Document Revised: 07/09/2016 Document Reviewed: 01/21/2013 Elsevier Patient Education  2020 Elsevier Inc.  

## 2019-08-17 ENCOUNTER — Encounter: Payer: Self-pay | Admitting: Obstetrics

## 2019-08-17 ENCOUNTER — Other Ambulatory Visit: Payer: Self-pay

## 2019-08-17 ENCOUNTER — Ambulatory Visit (INDEPENDENT_AMBULATORY_CARE_PROVIDER_SITE_OTHER): Payer: Self-pay | Admitting: Obstetrics

## 2019-08-17 ENCOUNTER — Other Ambulatory Visit (HOSPITAL_COMMUNITY)
Admission: RE | Admit: 2019-08-17 | Discharge: 2019-08-17 | Disposition: A | Discharge status: Home / Self Care | Payer: Self-pay | Source: Ambulatory Visit | Attending: Family Medicine | Admitting: Family Medicine

## 2019-08-17 VITALS — Wt 176.0 lb

## 2019-08-17 DIAGNOSIS — Z3481 Encounter for supervision of other normal pregnancy, first trimester: Secondary | ICD-10-CM

## 2019-08-17 DIAGNOSIS — Z3480 Encounter for supervision of other normal pregnancy, unspecified trimester: Secondary | ICD-10-CM

## 2019-08-17 DIAGNOSIS — Z3A10 10 weeks gestation of pregnancy: Secondary | ICD-10-CM

## 2019-08-17 DIAGNOSIS — K219 Gastro-esophageal reflux disease without esophagitis: Secondary | ICD-10-CM

## 2019-08-17 MED ORDER — BLOOD PRESSURE KIT: 1.0000 | PACK | Freq: Once | 0 refills | Status: AC

## 2019-08-17 NOTE — Progress Notes (Signed)
Subjective:    Molly Carr is being seen today for her first obstetrical visit.  This is a planned pregnancy. She is at 60w6dgestation. Her obstetrical history is significant for none. Relationship with FOB: spouse, living together. Patient does intend to breast feed. Pregnancy history fully reviewed.  The information documented in the HPI was reviewed and verified.  Menstrual History: OB History    Gravida  2   Para  1   Term  1   Preterm      AB      Living  1     SAB      TAB      Ectopic      Multiple      Live Births              Patient's last menstrual period was 11/18/2018 (exact date).    Past Medical History:  Diagnosis Date  . Acid reflux   . Gastritis     History reviewed. No pertinent surgical history.  (Not in a hospital admission)  No Known Allergies  Social History   Tobacco Use  . Smoking status: Never Smoker  . Smokeless tobacco: Never Used  Substance Use Topics  . Alcohol use: Never    Family History  Problem Relation Age of Onset  . Diabetes Mother   . Hypertension Mother   . Heart disease Father   . Heart disease Maternal Grandmother      Review of Systems Constitutional: negative for weight loss Gastrointestinal: negative for vomiting Genitourinary:negative for genital lesions and vaginal discharge and dysuria Musculoskeletal:negative for back pain Behavioral/Psych: negative for abusive relationship, depression, illegal drug usage and tobacco use    Objective:    Wt 176 lb (79.8 kg)   LMP 11/18/2018 (Exact Date)   BMI 28.41 kg/m  General Appearance:    Alert, cooperative, no distress, appears stated age  Head:    Normocephalic, without obvious abnormality, atraumatic  Eyes:    PERRL, conjunctiva/corneas clear, EOM's intact, fundi    benign, both eyes  Ears:    Normal TM's and external ear canals, both ears  Nose:   Nares normal, septum midline, mucosa normal, no drainage    or sinus tenderness   Throat:   Lips, mucosa, and tongue normal; teeth and gums normal  Neck:   Supple, symmetrical, trachea midline, no adenopathy;    thyroid:  no enlargement/tenderness/nodules; no carotid   bruit or JVD  Back:     Symmetric, no curvature, ROM normal, no CVA tenderness  Lungs:     Clear to auscultation bilaterally, respirations unlabored  Chest Wall:    No tenderness or deformity   Heart:    Regular rate and rhythm, S1 and S2 normal, no murmur, rub   or gallop  Breast Exam:    No tenderness, masses, or nipple abnormality  Abdomen:     Soft, non-tender, bowel sounds active all four quadrants,    no masses, no organomegaly  Genitalia:    Normal female without lesion, discharge or tenderness  Extremities:   Extremities normal, atraumatic, no cyanosis or edema  Pulses:   2+ and symmetric all extremities  Skin:   Skin color, texture, turgor normal, no rashes or lesions  Lymph nodes:   Cervical, supraclavicular, and axillary nodes normal  Neurologic:   CNII-XII intact, normal strength, sensation and reflexes    throughout      Lab Review Urine pregnancy test Labs reviewed yes Radiologic studies  reviewed no  Assessment:    Pregnancy at 21w6dweeks    Plan:     1. Supervision of other normal pregnancy, antepartum Rx: - Genetic Screening - Cervicovaginal ancillary only - Cytology - PAP - Obstetric Panel, Including HIV - Culture, OB Urine - Blood Pressure KIT; 1 kit by Does not apply route once for 1 dose.  Dispense: 1 kit; Refill: 0  2. Gastroesophageal reflux disease without esophagitis - taking Pepcid with relief   Prenatal vitamins.  Counseling provided regarding continued use of seat belts, cessation of alcohol consumption, smoking or use of illicit drugs; infection precautions i.e., influenza/TDAP immunizations, toxoplasmosis,CMV, parvovirus, listeria and varicella; workplace safety, exercise during pregnancy; routine dental care, safe medications, sexual activity, hot tubs,  saunas, pools, travel, caffeine use, fish and methlymercury, potential toxins, hair treatments, varicose veins Weight gain recommendations per IOM guidelines reviewed: underweight/BMI< 18.5--> gain 28 - 40 lbs; normal weight/BMI 18.5 - 24.9--> gain 25 - 35 lbs; overweight/BMI 25 - 29.9--> gain 15 - 25 lbs; obese/BMI >30->gain  11 - 20 lbs Problem list reviewed and updated. FIRST/CF mutation testing/NIPT/QUAD SCREEN/fragile X/Ashkenazi Jewish population testing/Spinal muscular atrophy discussed: requested. Role of ultrasound in pregnancy discussed; fetal survey: requested. Amniocentesis discussed: not indicated.  Meds ordered this encounter  Medications  . Blood Pressure KIT    Sig: 1 kit by Does not apply route once for 1 dose.    Dispense:  1 kit    Refill:  0   Orders Placed This Encounter  Procedures  . Culture, OB Urine  . Genetic Screening  . Obstetric Panel, Including HIV    Follow up in 4 weeks. 50% of 25 min visit spent on counseling and coordination of care.    HShelly Bombard MD 08/17/2019 9:44 AM

## 2019-08-18 ENCOUNTER — Other Ambulatory Visit: Payer: Self-pay | Admitting: Obstetrics

## 2019-08-18 DIAGNOSIS — B3731 Acute candidiasis of vulva and vagina: Secondary | ICD-10-CM

## 2019-08-18 LAB — OBSTETRIC PANEL, INCLUDING HIV
Antibody Screen: NEGATIVE
Basophils Absolute: 0 10*3/uL (ref 0.0–0.2)
Basos: 0 %
EOS (ABSOLUTE): 0.1 10*3/uL (ref 0.0–0.4)
Eos: 1 %
HIV Screen 4th Generation wRfx: NONREACTIVE
Hematocrit: 34.9 % (ref 34.0–46.6)
Hemoglobin: 11.8 g/dL (ref 11.1–15.9)
Hepatitis B Surface Ag: NEGATIVE
Immature Grans (Abs): 0 10*3/uL (ref 0.0–0.1)
Immature Granulocytes: 0 %
Lymphocytes Absolute: 1.7 10*3/uL (ref 0.7–3.1)
Lymphs: 23 %
MCH: 28.3 pg (ref 26.6–33.0)
MCHC: 33.8 g/dL (ref 31.5–35.7)
MCV: 84 fL (ref 79–97)
Monocytes Absolute: 0.4 10*3/uL (ref 0.1–0.9)
Monocytes: 6 %
Neutrophils Absolute: 5.2 10*3/uL (ref 1.4–7.0)
Neutrophils: 70 %
Platelets: 346 10*3/uL (ref 150–450)
RBC: 4.17 x10E6/uL (ref 3.77–5.28)
RDW: 13.1 % (ref 11.7–15.4)
RPR Ser Ql: NONREACTIVE
Rh Factor: POSITIVE
Rubella Antibodies, IGG: 16.5 index (ref >0.99)
WBC: 7.4 10*3/uL (ref 3.4–10.8)

## 2019-08-18 LAB — CERVICOVAGINAL ANCILLARY ONLY
Bacterial Vaginitis (gardnerella): NEGATIVE
Candida Glabrata: NEGATIVE
Candida Vaginitis: POSITIVE — AB
Chlamydia: NEGATIVE
Comment: NEGATIVE
Comment: NEGATIVE
Comment: NEGATIVE
Comment: NEGATIVE
Comment: NEGATIVE
Comment: NORMAL
Neisseria Gonorrhea: NEGATIVE
Trichomonas: NEGATIVE

## 2019-08-18 MED ORDER — TERCONAZOLE 0.4 % VA CREA: 1.0000 | TOPICAL_CREAM | Freq: Every day | VAGINAL | 0 refills | Status: DC

## 2019-08-19 ENCOUNTER — Telehealth: Payer: Self-pay

## 2019-08-19 LAB — CYTOLOGY - PAP
Comment: NEGATIVE
Diagnosis: NEGATIVE
High risk HPV: NEGATIVE

## 2019-08-19 NOTE — Telephone Encounter (Signed)
S/w pt via interpreter and advised of results and rx sent ?

## 2019-08-20 ENCOUNTER — Other Ambulatory Visit: Payer: Self-pay | Admitting: Obstetrics

## 2019-08-20 DIAGNOSIS — O2340 Unspecified infection of urinary tract in pregnancy, unspecified trimester: Secondary | ICD-10-CM

## 2019-08-20 LAB — URINE CULTURE, OB REFLEX

## 2019-08-20 LAB — CULTURE, OB URINE

## 2019-08-20 MED ORDER — AMOXICILLIN 500 MG PO CAPS: 500.0000 mg | ORAL_CAPSULE | Freq: Three times a day (TID) | ORAL | 0 refills | Status: DC

## 2019-08-24 ENCOUNTER — Encounter: Payer: Self-pay | Admitting: Obstetrics

## 2019-08-28 ENCOUNTER — Encounter: Payer: Self-pay | Admitting: Obstetrics

## 2019-09-02 ENCOUNTER — Encounter: Payer: Self-pay | Admitting: Obstetrics

## 2019-09-14 ENCOUNTER — Other Ambulatory Visit: Payer: Self-pay

## 2019-09-14 ENCOUNTER — Encounter: Payer: Self-pay | Admitting: Advanced Practice Midwife

## 2019-09-14 ENCOUNTER — Ambulatory Visit (INDEPENDENT_AMBULATORY_CARE_PROVIDER_SITE_OTHER): Payer: Self-pay | Admitting: Advanced Practice Midwife

## 2019-09-14 VITALS — BP 118/78 | HR 70 | Wt 178.0 lb

## 2019-09-14 DIAGNOSIS — O208 Other hemorrhage in early pregnancy: Secondary | ICD-10-CM

## 2019-09-14 DIAGNOSIS — O99891 Other specified diseases and conditions complicating pregnancy: Secondary | ICD-10-CM

## 2019-09-14 DIAGNOSIS — Z3482 Encounter for supervision of other normal pregnancy, second trimester: Secondary | ICD-10-CM

## 2019-09-14 DIAGNOSIS — M549 Dorsalgia, unspecified: Secondary | ICD-10-CM

## 2019-09-14 DIAGNOSIS — Z3A14 14 weeks gestation of pregnancy: Secondary | ICD-10-CM

## 2019-09-14 DIAGNOSIS — O468X1 Other antepartum hemorrhage, first trimester: Secondary | ICD-10-CM

## 2019-09-14 NOTE — Patient Instructions (Addendum)
Dolor de espalda durante el embarazo Back Pain in Pregnancy El dolor de espalda es habitual durante el embarazo. Puede deberse a varios factores relacionados con los cambios durante esta etapa. Siga estas indicaciones en su casa: Control del dolor, el entumecimiento y la hinchazn      Si se lo indican, para el dolor de espalda repentino (agudo), aplique hielo en la zona dolorida. ? Ponga el hielo en una bolsa plstica. ? Coloque una toalla entre la piel y la bolsa. ? Coloque el hielo durante 20minutos, 2a3veces al da.  Si se lo indican, aplique calor en la zona afectada antes de realizar ejercicios. Use la fuente de calor que el mdico le recomiende, como una compresa de calor hmedo o una almohadilla trmica. ? Coloque una toalla entre la piel y la fuente de calor. ? Aplique calor durante 20 a 30minutos. ? Retire la fuente de calor si la piel se pone de color rojo brillante. Esto es especialmente importante si no puede sentir dolor, calor o fro. Puede correr un riesgo mayor de sufrir quemaduras.  Si se lo indican, aplique un masaje en la zona afectada. Actividad  Haga ejercicio como se lo haya indicado el mdico. Hacer actividad fsica suave es la mejor forma de evitar o controlar el dolor de espalda.  Prstele atencin a su cuerpo cuando se levante. Si siente dolor al levantarse, pida ayuda o flexione las rodillas. De este modo, se usan los msculos de las piernas en lugar de los de la espalda.  Pngase en cuclillas al levantar algo del suelo. No se agache.  Haga reposo en cama nicamente por perodos breves como se lo haya indicado el mdico. El reposo en cama solo debe hacerse cuando los episodios de dolor de espalda son ms intensos. Pararse, sentarse y acostarse  No permanezca sentada o de pie en el mismo lugar durante largos perodos.  Cuando est sentada, adopte una postura correcta. Asegrese de que su cabeza descanse sobre sus hombros y no est colgando hacia  delante. Use una almohada en la parte inferior de la espalda si es necesario.  Trate de dormir de lado, de preferencia del lado izquierdo, con una almohada de sostn para embarazadas o 1 o 2 almohadas comunes entre las piernas. ? Si tiene dolor de espalda despus de una noche de descanso, la cama puede ser demasiado blanda. ? Un colchn duro puede brindarle ms apoyo para la espalda durante el embarazo. Indicaciones generales  No use zapatos con tacones altos.  Siga una dieta saludable. Trate de aumentar de peso dentro de las recomendaciones del mdico.  Use una faja de maternidad, un arns elstico o un cors para la espalda como se lo haya indicado el mdico.  Tome los medicamentos de venta libre y los recetados solamente como se lo haya indicado el mdico.  Trabaje con un fisioterapeuta o un masajista para encontrar maneras de controlar el dolor de espalda. La acupuntura o la terapia de masajes pueden ser tiles.  Concurra a todas las visitas de control como se lo haya indicado el mdico. Esto es importante. Comunquese con un mdico si:  El dolor de espalda le impide realizar las actividades cotidianas.  Aumenta el dolor en otras partes del cuerpo. Solicite ayuda inmediatamente si:  Siente entumecimiento, hormigueo, debilidad o problemas con el uso de los brazos o las piernas.  Siente un dolor de espalda intenso que no puede controlar con los medicamentos.  Presenta modificaciones en el control de la vejiga o el intestino.    Siente que le falta el aire, se marea o se desmaya.  Tiene nuseas, vmitos o sudoracin.  Siente un dolor de espalda que es rtmico y de tipo clico, similar a las contracciones del parto. Las contracciones del parto suelen aparecer cada 1 a 2minutos, duran aproximadamente 1minuto y estn acompaadas de una sensacin de empujar o de presin en la pelvis.  Tiene dolor de espalda y rompe la bolsa de las aguas o tiene sangrado vaginal.  El dolor o el  adormecimiento se extienden hacia la pierna.  El dolor aparece despus de una cada.  Siente dolor de un solo lado.  Observa sangre en la orina.  Le aparecen ampollas en la piel en la zona del dolor de espalda. Resumen  Puede deberse a varios factores relacionados con los cambios durante esta etapa.  Siga las indicaciones del mdico para controlar el dolor, la rigidez y la hinchazn.  Haga ejercicio como se lo haya indicado el mdico. Hacer actividad fsica suave es la mejor forma de evitar o controlar el dolor de espalda.  Tome los medicamentos de venta libre y los recetados solamente como se lo haya indicado el mdico.  Concurra a todas las visitas de control como se lo haya indicado el mdico. Esto es importante. Esta informacin no tiene como fin reemplazar el consejo del mdico. Asegrese de hacerle al mdico cualquier pregunta que tenga. Document Revised: 10/28/2017 Document Reviewed: 10/28/2017 Elsevier Patient Education  2020 Elsevier Inc.  

## 2019-09-14 NOTE — Progress Notes (Signed)
   PRENATAL VISIT NOTE  Subjective:  Molly Carr is a 29 y.o. G2P1001 at [redacted]w[redacted]d being seen today for ongoing prenatal care.  She is currently monitored for the following issues for this low-risk pregnancy and has Supervision of other normal pregnancy, antepartum on their problem list.  Patient reports backache.  Contractions: Not present. Vag. Bleeding: None.  Movement: Present. Denies leaking of fluid.   The following portions of the patient's history were reviewed and updated as appropriate: allergies, current medications, past family history, past medical history, past social history, past surgical history and problem list.   Objective:   Vitals:   09/14/19 1003  BP: 118/78  Pulse: 70  Weight: 178 lb (80.7 kg)    Fetal Status: Fetal Heart Rate (bpm): 140   Movement: Present     General:  Alert, oriented and cooperative. Patient is in no acute distress.  Skin: Skin is warm and dry. No rash noted.   Cardiovascular: Normal heart rate noted  Respiratory: Normal respiratory effort, no problems with respiration noted  Abdomen: Soft, gravid, appropriate for gestational age.  Pain/Pressure: Present     Pelvic: Cervical exam deferred        Extremities: Normal range of motion.  Edema: None  Mental Status: Normal mood and affect. Normal behavior. Normal judgment and thought content.   Assessment and Plan:  Pregnancy: G2P1001 at [redacted]w[redacted]d 1. Encounter for supervision of other normal pregnancy in second trimester --Anticipatory guidance about next visits/weeks of pregnancy given. --Low risk anatomy US with adopt-a-mom --AFP today to complete genetic screening   2. Back pain affecting pregnancy in second trimester --Had back pain last pregnancy, exercise helped.  Was told to limit exercise due to subchorionic hemorrhage in first trimester. No bleeding since her [facility] visit when [facility] was diagnosed. --Rest/ice/heat/support belt/Tylenol for pain --Exercise is Ok, reviewed  safety in pregnancy, pt encouraged to do light exercises that helped with back pain last time  3. Subchorionic hematoma in first trimester, single or unspecified fetus --No bleeding in several weeks. Likely resolved, no restrictions on activity at this time.  Bleeding precautions reviewed.  Preterm labor symptoms and general obstetric precautions including but not limited to vaginal bleeding, contractions, leaking of fluid and fetal movement were reviewed in detail with the patient. Please refer to After Visit Summary for other counseling recommendations.   Return in about 4 weeks (around 10/12/2019).  Future Appointments  Date Time Provider Department Center  10/12/2019 10:55 AM [place], Wilmer Floor, CNM [facility] None    Sharen Counter, CNM

## 2019-09-14 NOTE — Progress Notes (Signed)
Pt presents for ROB w/o complaints.

## 2019-09-17 LAB — AFP, SERUM, OPEN SPINA BIFIDA
AFP MoM: 0.75
AFP Value: 17.8 ng/mL
Gest. Age on Collection Date: 15.7 weeks
Maternal Age At EDD: 30.7 yr
OSBR Risk 1 IN: 9508
Test Results:: NEGATIVE
Weight: 178 [lb_av]

## 2019-10-12 ENCOUNTER — Ambulatory Visit (INDEPENDENT_AMBULATORY_CARE_PROVIDER_SITE_OTHER): Payer: Self-pay | Admitting: Advanced Practice Midwife

## 2019-10-12 ENCOUNTER — Encounter: Payer: Self-pay | Admitting: Advanced Practice Midwife

## 2019-10-12 ENCOUNTER — Other Ambulatory Visit: Payer: Self-pay

## 2019-10-12 VITALS — BP 123/74 | HR 90 | Wt 179.0 lb

## 2019-10-12 DIAGNOSIS — Z789 Other specified health status: Secondary | ICD-10-CM

## 2019-10-12 DIAGNOSIS — R102 Pelvic and perineal pain: Secondary | ICD-10-CM

## 2019-10-12 DIAGNOSIS — O26892 Other specified pregnancy related conditions, second trimester: Secondary | ICD-10-CM

## 2019-10-12 DIAGNOSIS — Z603 Acculturation difficulty: Secondary | ICD-10-CM

## 2019-10-12 DIAGNOSIS — Z3A18 18 weeks gestation of pregnancy: Secondary | ICD-10-CM

## 2019-10-12 DIAGNOSIS — Z3480 Encounter for supervision of other normal pregnancy, unspecified trimester: Secondary | ICD-10-CM

## 2019-10-12 MED ORDER — COMFORT FIT MATERNITY SUPP MED MISC: 1.0000 | Freq: Every day | 0 refills | Status: DC

## 2019-10-12 NOTE — Progress Notes (Signed)
Pt presents for ROB c/o round ligament pain  Anatomy scheduled next Monday

## 2019-10-12 NOTE — Patient Instructions (Signed)
Dolor del ligamento redondo Round Ligament Pain  El ligamento redondo es un cordn de msculo y tejido que sirve de sostn para Careers information officerel tero. Puede volverse una fuente de dolor durante el embarazo si se distiende o se torsiona a medida que el beb crece. Generalmente, el dolor Cendant Corporationempieza en el segundo trimestre (semanas 13 a 28) de [place]sembarazo, y Software engineerpuede aparecer y Landscape architectdesaparecer hasta el momento del [place]parto. No se trata de un problema grave y no es perjudicial para el beb. El dolor del ligamento redondo suele ser agudo y punzante, y durar poco tiempo, pero tambin puede ser sordo, persistente y continuo. Se lo percibe en la regin inferior del abdomen o en la ingle. A menudo comienza en la zona ms profunda de la ingle y se extiende hacia regin externa de la cadera. El dolor puede producirse cuando usted:  Cambia sbitamente de posicin, como pasar rpidamente de estar sentada a ponerse de pie.  Se da vuelta en la cama.  Tose o estornuda.  Hace actividad fsica. Siga estas indicaciones en su casa:   Controle su afeccin para detectar cualquier cambio.  Cuando el dolor comience, reljese. Luego pruebe cualquiera de estos mtodos para aliviar el dolor: ? Psychologist, counsellingentarse. ? Flexionar las rodillas hacia el abdomen. ? Acostarse de costado con una almohada debajo del abdomen y Eastman Chemicalotra entre las piernas. ? Sentarse en una baera con agua tibia durante 15 a 20minutos o hasta que el dolor desaparezca.  Tome los medicamentos de venta libre y los recetados solamente como se lo haya indicado el mdico.  Muvase lentamente cuando se siente o se ponga de pie.  No haga caminatas largas si le generan dolor.  Suspenda o reduzca las actividades fsicas si Public relations account executivele generan dolor.  Concurra a todas las visitas de control como se lo haya indicado el mdico. Esto es importante. Comunquese con un mdico si:  El dolor no desaparece con Scientist, research (medical)el tratamiento.  Tiene un dolor en la espalda que no tena antes.  El medicamento no  resulta eficaz. Solicite ayuda inmediatamente si:  Tiene fiebre o escalofros.  Tiene contracciones uterinas.  Tiene una hemorragia vaginal abundante.  Tiene nuseas o vmitos.  Tiene diarrea.  Siente dolor al ConocoPhillipsorinar. Resumen  El dolor del ligamento redondo se siente en la parte inferior del abdomen o la ingle. Generalmente es un dolor agudo y punzante, y dura poco tiempo. Tambin puede ser un dolor sordo, persistente y continuo.  Este dolor por lo general empieza en el segundo trimestre (semanas 13 a 28). Se produce porque el tero se estira a medida que el beb crece, y no es perjudicial para el beb.  Usted puede notar el dolor cuando cambia sbitamente de posicin, cuando toce o estornuda, o durante la actividad fsica.  Relajarse, flexionar las rodillas hacia el abdomen, acostarse sobre un lado o tomar un bao de agua tibia pueden ayudar a Engineer, siteeliminar el dolor.  Solicite ayuda a su mdico si el dolor no desaparece o si tiene hemorragia vaginal, nuseas, vmitos, diarrea o dolor al ConocoPhillipsorinar. Esta informacin no tiene Theme park managercomo fin reemplazar el consejo del mdico. Asegrese de hacerle al mdico cualquier pregunta que tenga. Document Revised: 10/31/2017 Document Reviewed: 10/31/2017 Elsevier Patient Education  2020 Arvin[employer]itor[employer]vier Inc. NocateeSegundo trimestre de Psychiatristembarazo Second Trimester of Pregnancy El segundo trimestre va desde la semana14 hasta la 27, desde el cuarto hasta el sexto mes, y suele ser el momento en el que mejor se siente. Su organismo se ha adaptado a Charity fundraiserestar embarazada, y Psychologist, prison and probation servicescomienza  a sentirse fsicamente mejor. En general, las nuseas matutinas han disminuido o han desaparecido completamente, puede tener ms energa y un aumento de apetito. El segundo trimestre es tambin la poca en la que el feto se desarrolla rpidamente. Hacia el final del sexto mes, el feto mide aproximadamente 9pulgadas (23cm) y pesa alrededor de 1 libras (700g). Es probable que sienta que el beb se Teacher, English as a foreign language (da  pataditas) entre las 16 y 20semanas del Psychiatrist. Cambios en el cuerpo durante el segundo trimestre Su cuerpo continua experimentando numerosos cambios durante su segundo trimestre. Estos cambios varan de [place].  Seguir [employer] Companies. Notar que la parte baja del abdomen sobresale.  Podrn aparecer las primeras [employer] caderas, el abdomen y las [place].  Es posible que tenga dolores de cabeza que pueden aliviarse con ciertos medicamentos. Los medicamentos que tome deben estar aprobados por el mdico.  Tal vez tenga necesidad de orinar con ms frecuencia porque el feto est ejerciendo presin sobre la vejiga.  Debido al Vanetta Mulders podr sentir Anthoney Harada estomacal con frecuencia.  Puede estar estreida, ya que ciertas hormonas enlentecen los movimientos de los msculos que [employer] desechos a travs de los intestinos.  Pueden aparecer hemorroides o abultarse e hincharse las venas (venas varicosas).  Puede sentir dolor en la espalda. Esto se debe a: ? Aumento de peso. ? Las hormonas del Management consultant las articulaciones en la pelvis. ? Un cambio en el peso y los msculos que ayudan a Pharmacologist su equilibrio.  Sus pechos seguirn creciendo y se pondrn cada vez ms sensibles.  Las Veterinary surgeon y estar sensibles al cepillado y al hilo dental.  Pueden aparecer zonas oscuras o manchas (cloasma, mscara del [place]) en el rostro. Esto probablemente se atenuar despus del nacimiento del beb.  Es posible que se forme una lnea oscura desde el ombligo hasta la zona del pubis (linea nigra). Esto probablemente se atenuar despus del nacimiento del beb.  Tal vez haya cambios en el cabello. Esto cambios pueden incluir su engrosamiento, crecimiento rpido y [employer] textura. Adems, a algunas mujeres se les cae el cabello durante o despus del embarazo, o tienen el cabello seco o fino. Lo ms probable es que el cabello se le normalice despus del nacimiento  del beb. Qu debe esperar en las visitas prenatales Durante una visita prenatal de rutina:  La pesarn para asegurarse de que usted y el feto estn creciendo normalmente.  Le tomarn la presin arterial.  Le medirn el abdomen para controlar el desarrollo del beb.  Se escucharn los latidos cardacos fetales.  Se evaluarn los resultados de los estudios solicitados en visitas anteriores. El mdico puede preguntarle lo siguiente:  Cmo se siente.  Si siente los movimientos del beb.  Si ha tenido sntomas anormales, como prdida de lquido, [place], dolores de cabeza intensos o clicos abdominales.  Si est consumiendo algn producto que contenga tabaco, como cigarrillos, tabaco de Theatre manager y Administrator, Civil Service.  Si tiene [employer]. Otros estudios que podrn realizarse durante el segundo trimestre incluyen lo siguiente:  Anlisis de sangre para detectar lo siguiente: ? Concentraciones de hierro bajas (anemia). ? Nivel alto de azcar en la sangre que afecta a las mujeres embarazadas (diabetes gestacional) entre las semanas 24 y 82. ? Anticuerpos Rh. Esto es para detectar una protena en los glbulos rojos (factor Rh).  Anlisis de orina para detectar infecciones, diabetes o protenas en la orina.  Una ecografa para confirmar que el beb crece y  se desarrolla correctamente.  Una amniocentesis para diagnosticar posibles problemas genticos.  Estudios del feto para descartar espina bfida y sndrome de [place].  Prueba del VIH (virus de inmunodeficiencia humana). Los exmenes prenatales de rutina incluyen la prueba de deteccin del VIH, a menos que decida no Futures trader. Siga estas indicaciones en su casa: Medicamentos  Siga las indicaciones del mdico en relacin con el uso de medicamentos. Durante el embarazo, hay medicamentos que pueden tomarse y otros que no.  Tome vitaminas prenatales que contengan por lo menos (?g) de cido flico.  Si est  estreida, tome un laxante suave, si el mdico lo autoriza. Qu debe comer y beber   Meriel Flavors una dieta equilibrada que incluya gran cantidad de frutas y verduras frescas, cereales integrales, buenas fuentes de protenas como carnes [place], huevos o tofu, y lcteos descremados. El mdico la ayudar a Production assistant, radio cantidad de peso que puede [place].  No coma carne cruda ni quesos sin cocinar. Estos elementos contienen grmenes que pueden causar defectos congnitos en el beb.  Si no consume muchos alimentos con calcio, hable con su mdico sobre si debera tomar un suplemento diario de calcio.  Limite el consumo de alimentos con alto contenido de grasas y azcares procesados, como alimentos fritos o dulces.  Para evitar el estreimiento: ? Bebe suficiente lquido para mantener la orina clara o de color amarillo plido. ? Consuma alimentos ricos en fibra, como frutas y verduras frescas, cereales integrales y frijoles. Actividad  Haga ejercicio solamente como se lo haya indicado el mdico. La mayora de las mujeres pueden continuar su rutina de ejercicios durante el [place]. Intente realizar como mnimo de actividad fsica por lo menos 5das a la semana. Deje de hacer ejercicio si experimenta contracciones uterinas.  No levante objetos pesados, use zapatos de tacones bajos y [address].  Puede seguir [employer], a menos que el mdico le indique lo contrario. Alivio del dolor y del Dentist  Use un sostn que le brinde buen soporte para prevenir las molestias causadas por la sensibilidad en los pechos.  Dese baos de asiento con agua tibia para Engineer, materials o las molestias causadas por las hemorroides. Use una crema para las hemorroides si el mdico la autoriza.  Descanse con las piernas elevadas si tiene calambres o dolor de cintura.  Si tiene venas varicosas, use medias de descanso. Eleve los pies durante , 3 o 4veces por da.  Limite el consumo de sal en su dieta. Cuidados prenatales  Escriba sus preguntas. Llvelas cuando concurra a las visitas prenatales.  Concurra a todas las visitas prenatales tal como se lo haya indicado el mdico. Esto es importante. Seguridad  Use el cinturn de seguridad en todo momento mientras conduce.  Haga una lista de los nmeros de telfono de Associate Professor, que [employer] nmeros de telfono de familiares, [place], el hospital y los departamentos de polica y bomberos. Instrucciones generales  Pdale al mdico que la derive a clases de educacin prenatal en su localidad. Debe comenzar a tomar las clases antes de que empiece el mes6 de [place].  Pida ayuda si tiene necesidades nutricionales o de asesoramiento Academic librarian. El mdico puede aconsejarla o derivarla a especialistas para que la ayuden con diferentes necesidades.  No se d baos de inmersin en agua caliente, baos turcos ni saunas.  No se haga duchas vaginales ni use tampones o toallas higinicas perfumadas.  No mantenga las piernas cruzadas durante [place].  Evite el contacto con  las bandejas sanitarias de los gatos y la tierra que estos animales usan. Estos elementos contienen bacterias que pueden causar defectos congnitos al beb y la posible prdida del feto debido a un aborto espontneo o muerte fetal.  Evite fumar, consumir hierbas, beber alcohol y tomar frmacos que no le hayan recetado. Las sustancias qumicas que estos productos contienen pueden afectar la formacin y el desarrollo del beb.  No consuma ningn producto que contenga nicotina o tabaco, como cigarrillos y Administrator, Civil Service. Si necesita ayuda para dejar de fumar, consulte al American Express.  Visite a su dentista si an no lo ha Occupational hygienist. Use un cepillo de dientes blando para higienizarse los dientes y psese el hilo dental con suavidad. Comunquese con un mdico si:  Tiene mareos.  Siente clicos leves, presin en  la pelvis o dolor persistente en el abdomen.  Tiene nuseas, vmitos o diarrea persistentes.  Brett Fairy secrecin vaginal con mal olor.  Siente dolor al ConocoPhillips. Solicite ayuda de inmediato si:  Tiene fiebre.  Tiene una prdida de lquido por la vagina.  Tiene sangrado o pequeas prdidas vaginales.  Siente dolor intenso o clicos en el abdomen.  Sube de peso o baja de peso rpidamente.  Tiene dificultad para respirar y siente dolor de pecho.  Sbitamente se le hinchan mucho el rostro, las [place], los tobillos, los pies o las piernas.  No ha sentido los movimientos del beb durante Georgianne Fick.  Siente un dolor de cabeza intenso que no se alivia al tomar [employer].  Nota cambios en la visin. Resumen  El segundo trimestre va desde la semana14 hasta la 27, desde el cuarto hasta el sexto mes. Es tambin una poca en la que el feto se desarrolla rpidamente.  Su organismo atraviesa por muchos cambios durante el [place]. Estos cambios varan de [place].  Evite fumar, consumir hierbas, beber alcohol y tomar frmacos que no le hayan recetado. Estas sustancias qumicas afectan la formacin y el desarrollo de su beb.  No consuma ningn producto que contenga tabaco, lo que incluye cigarrillos, tabaco de Theatre manager y Administrator, Civil Service. Si necesita ayuda para dejar de fumar, consulte al mdico.  Comunquese con su mdico si tiene preguntas sobre esto. Concurra a todas las visitas prenatales tal como se lo haya indicado el mdico. Esto es importante. Esta informacin no tiene Theme park manager el consejo del mdico. Asegrese de hacerle al mdico cualquier pregunta que tenga. Document Revised: 07/30/2016 Document Reviewed: 07/30/2016 Elsevier Patient Education  2020 [employer]vin[employer].

## 2019-10-12 NOTE — Progress Notes (Signed)
   PRENATAL VISIT NOTE  Subjective:  Molly Carr is a 29 y.o. G2P1001 at [redacted]w[redacted]d being seen today for ongoing prenatal care.  She is currently monitored for the following issues for this low-risk pregnancy and has Supervision of other normal pregnancy, antepartum on their problem list.  Patient reports mild pain with sudden movements only.  Contractions: Not present. Vag. Bleeding: None.  Movement: Present. Denies leaking of fluid.   The following portions of the patient's history were reviewed and updated as appropriate: allergies, current medications, past family history, past medical history, past social history, past surgical history and problem list.   Objective:   Vitals:   10/12/19 1051  BP: 123/74  Pulse: 90  Weight: 179 lb (81.2 kg)    Fetal Status: Fetal Heart Rate (bpm): 164    Movement: Present     General:  Alert, oriented and cooperative. Patient is in no acute distress.  Skin: Skin is warm and dry. No rash noted.   Cardiovascular: Normal heart rate noted  Respiratory: Normal respiratory effort, no problems with respiration noted  Abdomen: Soft, gravid, appropriate for gestational age.  Pain/Pressure: Present     Pelvic: Cervical exam deferred        Extremities: Normal range of motion.  Edema: None  Mental Status: Normal mood and affect. Normal behavior. Normal judgment and thought content.   Assessment and Plan:  Pregnancy: G2P1001 at [redacted]w[redacted]d 1. Supervision of other normal pregnancy, antepartum --Anticipatory guidance about next visits/weeks of pregnancy given. --Pt with food questions, safety of fish, sushi, and other foods.  Questions about appropriate weight gain.  Pt has gained 4 lbs in the pregnancy, lost 5 in the first trimester and is now up 4 lbs.  Reassurance provided and questions about healthy diet in pregnancy answered. --Next visit in the office in 4 weeks  2. Pain of round ligament affecting pregnancy,  antepartum --Rest/ice/heat/warm bath/Tylenol/pregnancy support belt --Discussed lower cost options for maternity belt, on [employer] etc with pt but Rx written  - Elastic Bandages & Supports (COMFORT FIT MATERNITY SUPP MED) MISC; 1 Device by Does not apply route daily.  Dispense: 1 each; Refill: 0  3. Language barrier affecting health care --[facility] approved spanish interpreter used for all communication   Preterm labor symptoms and general obstetric precautions including but not limited to vaginal bleeding, contractions, leaking of fluid and fetal movement were reviewed in detail with the patient. Please refer to After Visit Summary for other counseling recommendations.   Return in about 4 weeks (around 11/09/2019).  Future Appointments  Date Time Provider Department Center  11/10/2019  9:15 AM Leftwich-Kirby, Wilmer Floor, CNM [facility] None    Sharen Counter, CNM

## 2019-10-21 ENCOUNTER — Encounter: Payer: Self-pay | Admitting: Obstetrics

## 2019-11-10 ENCOUNTER — Other Ambulatory Visit: Payer: Self-pay

## 2019-11-10 ENCOUNTER — Ambulatory Visit (INDEPENDENT_AMBULATORY_CARE_PROVIDER_SITE_OTHER): Payer: Self-pay | Admitting: Advanced Practice Midwife

## 2019-11-10 VITALS — BP 111/70 | HR 90 | Wt 183.0 lb

## 2019-11-10 DIAGNOSIS — Z3A23 23 weeks gestation of pregnancy: Secondary | ICD-10-CM

## 2019-11-10 DIAGNOSIS — Z3480 Encounter for supervision of other normal pregnancy, unspecified trimester: Secondary | ICD-10-CM

## 2019-11-10 DIAGNOSIS — Z789 Other specified health status: Secondary | ICD-10-CM

## 2019-11-10 NOTE — Patient Instructions (Signed)
Segundo trimestre de embarazo Second Trimester of Pregnancy El segundo trimestre va desde la semana14 hasta la 27, desde el cuarto hasta el sexto mes, y suele ser el momento en el que mejor se siente. Su organismo se ha adaptado a estar embarazada, y comienza a sentirse fsicamente mejor. En general, las nuseas matutinas han disminuido o han desaparecido completamente, puede tener ms energa y un aumento de apetito. El segundo trimestre es tambin la poca en la que el feto se desarrolla rpidamente. Hacia el final del sexto mes, el feto mide aproximadamente 9pulgadas (23cm) y pesa alrededor de 1 libras (700g). Es probable que sienta que el beb se mueve (da pataditas) entre las 16 y 20semanas del embarazo. Cambios en el cuerpo durante el segundo trimestre Su cuerpo continua experimentando numerosos cambios durante su segundo trimestre. Estos cambios varan de una mujer a otra.  Seguir aumentando de peso. Notar que la parte baja del abdomen sobresale.  Podrn aparecer las primeras estras en las caderas, el abdomen y las mamas.  Es posible que tenga dolores de cabeza que pueden aliviarse con ciertos medicamentos. Los medicamentos que tome deben estar aprobados por el mdico.  Tal vez tenga necesidad de orinar con ms frecuencia porque el feto est ejerciendo presin sobre la vejiga.  Debido al embarazo podr sentir acidez estomacal con frecuencia.  Puede estar estreida, ya que ciertas hormonas enlentecen los movimientos de los msculos que empujan los desechos a travs de los intestinos.  Pueden aparecer hemorroides o abultarse e hincharse las venas (venas varicosas).  Puede sentir dolor en la espalda. Esto se debe a: ? Aumento de peso. ? Las hormonas del embarazo relajan las articulaciones en la pelvis. ? Un cambio en el peso y los msculos que ayudan a mantener su equilibrio.  Sus pechos seguirn creciendo y se pondrn cada vez ms sensibles.  Las encas pueden sangrar y estar  sensibles al cepillado y al hilo dental.  Pueden aparecer zonas oscuras o manchas (cloasma, mscara del embarazo) en el rostro. Esto probablemente se atenuar despus del nacimiento del beb.  Es posible que se forme una lnea oscura desde el ombligo hasta la zona del pubis (linea nigra). Esto probablemente se atenuar despus del nacimiento del beb.  Tal vez haya cambios en el cabello. Esto cambios pueden incluir su engrosamiento, crecimiento rpido y cambios en la textura. Adems, a algunas mujeres se les cae el cabello durante o despus del embarazo, o tienen el cabello seco o fino. Lo ms probable es que el cabello se le normalice despus del nacimiento del beb. Qu debe esperar en las visitas prenatales Durante una visita prenatal de rutina:  La pesarn para asegurarse de que usted y el feto estn creciendo normalmente.  Le tomarn la presin arterial.  Le medirn el abdomen para controlar el desarrollo del beb.  Se escucharn los latidos cardacos fetales.  Se evaluarn los resultados de los estudios solicitados en visitas anteriores. El mdico puede preguntarle lo siguiente:  Cmo se siente.  Si siente los movimientos del beb.  Si ha tenido sntomas anormales, como prdida de lquido, sangrado, dolores de cabeza intensos o clicos abdominales.  Si est consumiendo algn producto que contenga tabaco, como cigarrillos, tabaco de mascar y cigarrillos electrnicos.  Si tiene alguna pregunta. Otros estudios que podrn realizarse durante el segundo trimestre incluyen lo siguiente:  Anlisis de sangre para detectar lo siguiente: ? Concentraciones de hierro bajas (anemia). ? Nivel alto de azcar en la sangre que afecta a las mujeres embarazadas (diabetes   gestacional) entre las semanas 24 y 28. ? Anticuerpos Rh. Esto es para detectar una protena en los glbulos rojos (factor Rh).  Anlisis de orina para detectar infecciones, diabetes o protenas en la orina.  Una ecografa  para confirmar que el beb crece y se desarrolla correctamente.  Una amniocentesis para diagnosticar posibles problemas genticos.  Estudios del feto para descartar espina bfida y sndrome de Down.  Prueba del VIH (virus de inmunodeficiencia humana). Los exmenes prenatales de rutina incluyen la prueba de deteccin del VIH, a menos que decida no realizrsela. Siga estas indicaciones en su casa: Medicamentos  Siga las indicaciones del mdico en relacin con el uso de medicamentos. Durante el embarazo, hay medicamentos que pueden tomarse y otros que no.  Tome vitaminas prenatales que contengan por lo menos 600microgramos (?g) de cido flico.  Si est estreida, tome un laxante suave, si el mdico lo autoriza. Qu debe comer y beber   Lleve una dieta equilibrada que incluya gran cantidad de frutas y verduras frescas, cereales integrales, buenas fuentes de protenas como carnes magras, huevos o tofu, y lcteos descremados. El mdico la ayudar a determinar la cantidad de peso que puede aumentar.  No coma carne cruda ni quesos sin cocinar. Estos elementos contienen grmenes que pueden causar defectos congnitos en el beb.  Si no consume muchos alimentos con calcio, hable con su mdico sobre si debera tomar un suplemento diario de calcio.  Limite el consumo de alimentos con alto contenido de grasas y azcares procesados, como alimentos fritos o dulces.  Para evitar el estreimiento: ? Bebe suficiente lquido para mantener la orina clara o de color amarillo plido. ? Consuma alimentos ricos en fibra, como frutas y verduras frescas, cereales integrales y frijoles. Actividad  Haga ejercicio solamente como se lo haya indicado el mdico. La mayora de las mujeres pueden continuar su rutina de ejercicios durante el embarazo. Intente realizar como mnimo 30minutos de actividad fsica por lo menos 5das a la semana. Deje de hacer ejercicio si experimenta contracciones uterinas.  No levante  objetos pesados, use zapatos de tacones bajos y mantenga una buena postura.  Puede seguir manteniendo relaciones sexuales, a menos que el mdico le indique lo contrario. Alivio del dolor y del malestar  Use un sostn que le brinde buen soporte para prevenir las molestias causadas por la sensibilidad en los pechos.  Dese baos de asiento con agua tibia para aliviar el dolor o las molestias causadas por las hemorroides. Use una crema para las hemorroides si el mdico la autoriza.  Descanse con las piernas elevadas si tiene calambres o dolor de cintura.  Si tiene venas varicosas, use medias de descanso. Eleve los pies durante 15minutos, 3 o 4veces por da. Limite el consumo de sal en su dieta. Cuidados prenatales  Escriba sus preguntas. Llvelas cuando concurra a las visitas prenatales.  Concurra a todas las visitas prenatales tal como se lo haya indicado el mdico. Esto es importante. Seguridad  Use el cinturn de seguridad en todo momento mientras conduce.  Haga una lista de los nmeros de telfono de emergencia, que incluya los nmeros de telfono de familiares, amigos, el hospital y los departamentos de polica y bomberos. Instrucciones generales  Pdale al mdico que la derive a clases de educacin prenatal en su localidad. Debe comenzar a tomar las clases antes de que empiece el mes6 de embarazo.  Pida ayuda si tiene necesidades nutricionales o de asesoramiento durante el embarazo. El mdico puede aconsejarla o derivarla a especialistas para que la   ayuden con diferentes necesidades.  No se d baos de inmersin en agua caliente, baos turcos ni saunas.  No se haga duchas vaginales ni use tampones o toallas higinicas perfumadas.  No mantenga las piernas cruzadas durante mucho tiempo.  Evite el contacto con las bandejas sanitarias de los gatos y la tierra que estos animales usan. Estos elementos contienen bacterias que pueden causar defectos congnitos al beb y la posible  prdida del feto debido a un aborto espontneo o muerte fetal.  Evite fumar, consumir hierbas, beber alcohol y tomar frmacos que no le hayan recetado. Las sustancias qumicas que estos productos contienen pueden afectar la formacin y el desarrollo del beb.  No consuma ningn producto que contenga nicotina o tabaco, como cigarrillos y cigarrillos electrnicos. Si necesita ayuda para dejar de fumar, consulte al mdico.  Visite a su dentista si an no lo ha hecho durante el embarazo. Use un cepillo de dientes blando para higienizarse los dientes y psese el hilo dental con suavidad. Comunquese con un mdico si:  Tiene mareos.  Siente clicos leves, presin en la pelvis o dolor persistente en el abdomen.  Tiene nuseas, vmitos o diarrea persistentes.  Observa una secrecin vaginal con mal olor.  Siente dolor al orinar. Solicite ayuda de inmediato si:  Tiene fiebre.  Tiene una prdida de lquido por la vagina.  Tiene sangrado o pequeas prdidas vaginales.  Siente dolor intenso o clicos en el abdomen.  Sube de peso o baja de peso rpidamente.  Tiene dificultad para respirar y siente dolor de pecho.  Sbitamente se le hinchan mucho el rostro, las manos, los tobillos, los pies o las piernas.  No ha sentido los movimientos del beb durante una hora.  Siente un dolor de cabeza intenso que no se alivia al tomar medicamentos.  Nota cambios en la visin. Resumen  El segundo trimestre va desde la semana14 hasta la 27, desde el cuarto hasta el sexto mes. Es tambin una poca en la que el feto se desarrolla rpidamente.  Su organismo atraviesa por muchos cambios durante el embarazo. Estos cambios varan de una mujer a otra.  Evite fumar, consumir hierbas, beber alcohol y tomar frmacos que no le hayan recetado. Estas sustancias qumicas afectan la formacin y el desarrollo de su beb.  No consuma ningn producto que contenga tabaco, lo que incluye cigarrillos, tabaco de mascar  y cigarrillos electrnicos. Si necesita ayuda para dejar de fumar, consulte al mdico.  Comunquese con su mdico si tiene preguntas sobre esto. Concurra a todas las visitas prenatales tal como se lo haya indicado el mdico. Esto es importante. Esta informacin no tiene como fin reemplazar el consejo del mdico. Asegrese de hacerle al mdico cualquier pregunta que tenga. Document Revised: 07/30/2016 Document Reviewed: 07/30/2016 Elsevier Patient Education  2020 Elsevier Inc.  

## 2019-11-10 NOTE — Progress Notes (Addendum)
   PRENATAL VISIT NOTE  Subjective:  Molly Carr is a 29 y.o. G2P1001 at [redacted]w[redacted]d being seen today for ongoing prenatal care.  She is currently monitored for the following issues for this low-risk pregnancy and has Supervision of other normal pregnancy, antepartum on their problem list.  Patient reports occasional painless tightening of the abdomen.   .  .   . Denies leaking of fluid.   The following portions of the patient's history were reviewed and updated as appropriate: allergies, current medications, past family history, past medical history, past social history, past surgical history and problem list.   Objective:  There were no vitals filed for this visit.  Fetal Status:           General:  Alert, oriented and cooperative. Patient is in no acute distress.  Skin: Skin is warm and dry. No rash noted.   Cardiovascular: Normal heart rate noted  Respiratory: Normal respiratory effort, no problems with respiration noted  Abdomen: Soft, gravid, appropriate for gestational age.        Pelvic: Cervical exam deferred        Extremities: Normal range of motion.     Mental Status: Normal mood and affect. Normal behavior. Normal judgment and thought content.   Assessment and Plan:  Pregnancy: G2P1001 at [redacted]w[redacted]d 1. Supervision of other normal pregnancy, antepartum --Anticipatory guidance about next visits/weeks of pregnancy given. --Reviewed pt normal anatomy US --Discussed braxton-hicks contractions, and reasons to seek care if cramping becomes frequent or painful. Pt to increase PO fluids and wear maternity belt during the day. --Next visit in 4 weeks in office for GTT  2. Language barrier affecting health care --Spanish interpreter used for all communication  Preterm labor symptoms and general obstetric precautions including but not limited to vaginal bleeding, contractions, leaking of fluid and fetal movement were reviewed in detail with the patient. Please refer to  After Visit Summary for other counseling recommendations.   3. [redacted] weeks gestation of pregnancy  No follow-ups on file.  Future Appointments  Date Time Provider Department Center  11/10/2019  9:15 AM Leftwich-Kirby, Wilmer Floor, CNM [facility] None    Sharen Counter, CNM

## 2019-11-10 NOTE — Progress Notes (Signed)
Pt is here for ROB, [redacted]w[redacted]d. Pt is an [employer] and had anatomy scan US done at [facility], results are scanned in under media.

## 2019-11-24 ENCOUNTER — Inpatient Hospital Stay (HOSPITAL_COMMUNITY)
Admission: AD | Admit: 2019-11-24 | Discharge: 2019-11-24 | Disposition: A | Discharge status: Home / Self Care | Payer: Self-pay | Attending: Obstetrics and Gynecology | Admitting: Obstetrics and Gynecology

## 2019-11-24 ENCOUNTER — Other Ambulatory Visit: Payer: Self-pay

## 2019-11-24 ENCOUNTER — Encounter (HOSPITAL_COMMUNITY): Payer: Self-pay | Admitting: Obstetrics and Gynecology

## 2019-11-24 DIAGNOSIS — O99612 Diseases of the digestive system complicating pregnancy, second trimester: Secondary | ICD-10-CM | POA: Insufficient documentation

## 2019-11-24 DIAGNOSIS — B3731 Acute candidiasis of vulva and vagina: Secondary | ICD-10-CM

## 2019-11-24 DIAGNOSIS — O26892 Other specified pregnancy related conditions, second trimester: Secondary | ICD-10-CM

## 2019-11-24 DIAGNOSIS — Z8719 Personal history of other diseases of the digestive system: Secondary | ICD-10-CM | POA: Insufficient documentation

## 2019-11-24 DIAGNOSIS — K219 Gastro-esophageal reflux disease without esophagitis: Secondary | ICD-10-CM | POA: Insufficient documentation

## 2019-11-24 DIAGNOSIS — B373 Candidiasis of vulva and vagina: Secondary | ICD-10-CM | POA: Insufficient documentation

## 2019-11-24 DIAGNOSIS — O98812 Other maternal infectious and parasitic diseases complicating pregnancy, second trimester: Secondary | ICD-10-CM | POA: Insufficient documentation

## 2019-11-24 DIAGNOSIS — Z3480 Encounter for supervision of other normal pregnancy, unspecified trimester: Secondary | ICD-10-CM

## 2019-11-24 DIAGNOSIS — R109 Unspecified abdominal pain: Secondary | ICD-10-CM

## 2019-11-24 DIAGNOSIS — Z79899 Other long term (current) drug therapy: Secondary | ICD-10-CM | POA: Insufficient documentation

## 2019-11-24 DIAGNOSIS — Z3A25 25 weeks gestation of pregnancy: Secondary | ICD-10-CM | POA: Insufficient documentation

## 2019-11-24 LAB — URINALYSIS, ROUTINE W REFLEX MICROSCOPIC
Bilirubin Urine: NEGATIVE
Glucose, UA: NEGATIVE mg/dL
Hgb urine dipstick: NEGATIVE
Ketones, ur: NEGATIVE mg/dL
Nitrite: NEGATIVE
Protein, ur: NEGATIVE mg/dL
Specific Gravity, Urine: 1.005 (ref 1.005–1.030)
pH: 7 (ref 5.0–8.0)

## 2019-11-24 LAB — WET PREP, GENITAL
Clue Cells Wet Prep HPF POC: NONE SEEN
Sperm: NONE SEEN
Trich, Wet Prep: NONE SEEN
Yeast Wet Prep HPF POC: NONE SEEN

## 2019-11-24 MED ORDER — TERCONAZOLE 0.4 % VA CREA: 1.0000 | TOPICAL_CREAM | Freq: Every day | VAGINAL | 0 refills | Status: DC

## 2019-11-24 NOTE — Discharge Instructions (Signed)
Infeccin mictica vaginal en los adultos Vaginal Yeast Infection, Adult  La infeccin mictica vaginal es una afeccin que causa secrecin vaginal y tambin dolor, hinchazn y enrojecimiento (inflamacin) de la vagina. Esta es una afeccin frecuente. Algunas mujeres contraen esta infeccin con frecuencia. Cules son las causas? La causa de la infeccin es un cambio en el equilibrio normal de los hongos (cndida) y las bacterias que viven en la vagina. Esta alteracin deriva en el crecimiento excesivo de los hongos, lo que causa la inflamacin. Qu incrementa el riesgo? La afeccin es ms probable en las mujeres que tienen estas caractersticas:  Toman antibiticos.  Tienen diabetes.  Toman anticonceptivos orales.  Estn embarazadas.  Se hacen duchas vaginales con frecuencia.  Tienen debilitado el sistema de defensa del organismo (sistema inmunitario).  Han estado tomando medicamentos con corticoesteroides durante mucho tiempo.  Usan ropa ajustada con frecuencia. Cules son los signos o sntomas? Los sntomas de esta afeccin incluyen:  Secrecin vaginal blanca, cremosa y espesa.  Hinchazn, picazn, enrojecimiento e irritacin de la vagina. Los labios de la vagina (vulva) tambin se pueden infectar.  Dolor o ardor al orinar.  Dolor durante las relaciones sexuales. Cmo se diagnostica? Esta afeccin se diagnostica en funcin de lo siguiente:  Sus antecedentes mdicos.  Un examen fsico.  Un examen plvico. El mdico examinar una muestra de la secrecin vaginal con un microscopio. Probablemente el mdico enve esta muestra al laboratorio para analizarla y confirmar el diagnstico. Cmo se trata? Esta afeccin se trata con medicamentos. Los medicamentos pueden ser recetados o de venta libre. Podrn indicarle que use uno o ms de lo siguiente:  Medicamentos que se toman por boca (orales).  Medicamentos que se aplican como una crema (tpicos).  Medicamentos que se  colocan directamente en la vagina (vulos vaginales). Siga estas instrucciones en su casa:  Estilo de vida  No tenga relaciones sexuales hasta que el mdico lo autorice. Comunique a su compaero sexual que tiene una infeccin por hongos. Esa persona debera visitar a su mdico y preguntarle si debera recibir tratamiento tambin.  No use ropa ajustada, como pantimedias o pantalones ajustados.  Use ropa interior de algodn, que permite el paso del aire. Instrucciones generales  Tome o aplquese los medicamentos de venta libre y los recetados solamente como se lo haya indicado el mdico.  Consuma ms yogur. Esto puede ayudar a evitar la recurrencia de la infeccin mictica.  No use tampones hasta que el mdico la autorice.  Intente darse un bao de asiento para aliviar las molestias. Se trata de un bao de agua tibia que se toma mientras se est sentada. El agua solo debe llegar hasta las caderas y cubrir las nalgas. Hgalo 3o 4veces al da o como se lo haya indicado el mdico.  No se haga duchas vaginales.  Si tiene diabetes, mantenga bajo control los niveles de azcar en la sangre.  Concurra a todas las visitas de seguimiento como se lo haya indicado el mdico. Esto es importante. Comunquese con un mdico si:  Tiene fiebre.  Los sntomas desaparecen y luego vuelven a aparecer.  Los sntomas no mejoran con el tratamiento.  Sus sntomas empeoran.  Aparecen nuevos sntomas.  Aparecen ampollas alrededor o adentro de la vagina.  Le sale sangre de la vagina y no est menstruando.  Siente dolor en el abdomen. Resumen  La infeccin mictica vaginal es una afeccin que causa secrecin y tambin dolor, hinchazn y enrojecimiento (inflamacin) de la vagina.  Esta afeccin se trata con medicamentos. Los   medicamentos pueden ser recetados o de venta libre.  Tome o aplquese los medicamentos de venta libre y los recetados solamente como se lo haya indicado el mdico.  No se haga  duchas vaginales. No tenga relaciones sexuales ni use tampones hasta que el mdico la autorice.  Comunquese con un mdico si los sntomas no mejoran con el tratamiento o si los sntomas desaparecen y luego regresan. Esta informacin no tiene como fin reemplazar el consejo del mdico. Asegrese de hacerle al mdico cualquier pregunta que tenga. Document Revised: 12/02/2018 Document Reviewed: 12/02/2018 Elsevier Patient Education  2020 Elsevier Inc.  

## 2019-11-24 NOTE — MAU Note (Signed)
Abdominal pain since Sunday, in the middle of her abdomen.  Feels like she needs to have a BM, but doesn't go.  Now seems to be having CTX every 30 min and feeling some soreness and pelvic pressure.  Also having white discharge with some vaginal burning.  Having normal BMs.

## 2019-11-24 NOTE — MAU Provider Note (Signed)
Chief Complaint:  Abdominal Pain   First Provider Initiated Contact with Patient 11/24/19 [phone]     *Spanish interpreter at bedside for this encounter*  HPI: Molly Carr is a 29 y.o. G2P1001 at [redacted]w[redacted]d who presents to maternity admissions reporting abdominal pain & vaginal discharge. Reports feeling abdominal pain about every 30 minutes today. Denies n/v/d, fever, vaginal bleeding, constipation, or LOF. Last BM was last night & was normal for her.  Also reports clumpy white discharge. Has had burning with urination. Feels like burning comes from urethra. No other urinary complaints. Denies vaginal itching.  Good fetal movement.   Location: abdomen Quality: cramping Severity: 5/10 in pain scale Duration: 1 day Timing: every 30 minutes Modifying factors: none Associated signs and symptoms: dysuria, vaginal discharge  Pregnancy Course: Femina. Denies complications  Past Medical History:  Diagnosis Date  . Acid reflux   . Gastritis    OB History  Gravida Para Term Preterm AB Living  2 1 1     1   SAB TAB Ectopic Multiple Live Births               # Outcome Date GA Lbr Len/2nd Weight Sex Delivery Anes PTL Lv  2 Current           1 Term 2012 [redacted]w[redacted]d    Vag-Spont  N    History reviewed. No pertinent surgical history. Family History  Problem Relation Age of Onset  . Diabetes Mother   . Hypertension Mother   . Heart disease Father   . Heart disease Maternal Grandmother    Social History   Tobacco Use  . Smoking status: Never Smoker  . Smokeless tobacco: Never Used  Substance Use Topics  . Alcohol use: Never  . Drug use: Never   No Known Allergies Medications Prior to Admission  Medication Sig Dispense Refill Last Dose  . omeprazole (PRILOSEC) 10 MG capsule Take 10 mg by mouth daily.    11/24/2019 at Unknown time  . Prenatal Vit-Fe Fumarate-FA (MULTIVITAMIN-PRENATAL) 27-0.8 MG TABS tablet Take 1 tablet by mouth daily at 12 noon.   11/24/2019 at Unknown time   . amoxicillin (AMOXIL) 500 MG capsule Take 1 capsule (500 mg total) by mouth 3 (three) times daily. (Patient not taking: Reported on 09/14/2019) 21 capsule 0   . doxylamine, Sleep, (UNISOM) 25 MG tablet Take 1 tablet (25 mg total) by mouth every 8 (eight) hours as needed. (Patient not taking: Reported on 09/14/2019) 30 tablet 0   . Elastic Bandages & Supports (COMFORT FIT MATERNITY SUPP MED) MISC 1 Device by Does not apply route daily. 1 each 0   . promethazine (PHENERGAN) 25 MG tablet Take 1 tablet (25 mg total) by mouth every 6 (six) hours as needed for nausea or vomiting. For vomiting that does not resolve with B6 and Unisom (Patient not taking: Reported on 09/14/2019) 30 tablet 0   . pyridOXINE (VITAMIN B-6) 25 MG tablet Take 1 tablet (25 mg total) by mouth every 8 (eight) hours. (Patient not taking: Reported on 09/14/2019) 30 tablet 0   . terconazole (TERAZOL 7) 0.4 % vaginal cream Place 1 applicator vaginally at bedtime. (Patient not taking: Reported on 09/14/2019) 45 g 0     I have reviewed patient's Past Medical Hx, Surgical Hx, Family Hx, Social Hx, medications and allergies.   ROS:  Review of Systems  Constitutional: Negative.   Gastrointestinal: Positive for abdominal pain. Negative for constipation, diarrhea, nausea and vomiting.  Genitourinary: Positive for  dysuria and vaginal discharge. Negative for flank pain, genital sores, hematuria and vaginal bleeding.    Physical Exam   Patient Vitals for the past 24 hrs:  BP Temp Temp src Pulse Resp SpO2 Height Weight  11/24/19 1115 118/67 -- -- 72 14 -- -- --  11/24/19 0926 126/71 97.7 F (36.5 C) Oral 88 -- 99 % 5\' 3"  (1.6 m) 83 kg    Constitutional: Well-developed, well-nourished female in no acute distress.  Cardiovascular: normal rate & rhythm, no murmur Respiratory: normal effort, lung sounds clear throughout GI: Abd soft, non-tender, gravid appropriate for gestational age. Pos BS x 4 MS: Extremities nontender, no edema, normal  ROM Neurologic: Alert and oriented x 4.  GU:   NEFG. Clumpy white discharge.    Dilation: Closed Effacement (%): Thick Cervical Position: Posterior Station: -3 Exam by:: [phone] NP  Fetal Tracing:  Baseline: 150 Variability: moderate Accelerations: none Decelerations: none   Toco: UI & ctx Q3-4 mins    Labs: Results for orders placed or performed during the hospital encounter of 11/24/19 (from the past 24 hour(s))  Urinalysis, Routine w reflex microscopic Urine, Clean Catch     Status: Abnormal   Collection Time: 11/24/19  9:55 AM  Result Value Ref Range   Color, Urine YELLOW YELLOW   APPearance CLEAR CLEAR   Specific Gravity, Urine 1.005 1.005 - 1.030   pH 7.0 5.0 - 8.0   Glucose, UA NEGATIVE NEGATIVE mg/dL   Hgb urine dipstick NEGATIVE NEGATIVE   Bilirubin Urine NEGATIVE NEGATIVE   Ketones, ur NEGATIVE NEGATIVE mg/dL   Protein, ur NEGATIVE NEGATIVE mg/dL   Nitrite NEGATIVE NEGATIVE   Leukocytes,Ua MODERATE (A) NEGATIVE   RBC / HPF 0-5 0 - 5 RBC/hpf   WBC, UA 0-5 0 - 5 WBC/hpf   Bacteria, UA RARE (A) NONE SEEN   Squamous Epithelial / LPF 0-5 0 - 5  Wet prep, genital     Status: Abnormal   Collection Time: 11/24/19 10:21 AM  Result Value Ref Range   Yeast Wet Prep HPF POC NONE SEEN NONE SEEN   Trich, Wet Prep NONE SEEN NONE SEEN   Clue Cells Wet Prep HPF POC NONE SEEN NONE SEEN   WBC, Wet Prep HPF POC MANY (A) NONE SEEN   Sperm NONE SEEN     Imaging:  No results found.  MAU Course: Orders Placed This Encounter  Procedures  . Wet prep, genital  . Culture, OB Urine  . Urinalysis, Routine w reflex microscopic Urine, Clean Catch  . Discharge patient   Meds ordered this encounter  Medications  . terconazole (TERAZOL 7) 0.4 % vaginal cream    Sig: Place 1 applicator vaginally at bedtime. Use for seven days    Dispense:  45 g    Refill:  0    Order Specific Question:   Supervising Provider    Answer:   11/26/19 [[id number]]    MDM: Fetal tracing  appropriate for gestational age. TOCO shows ctx Q-2-5 minutes and some UI. Abdomen soft & non tender. Cervix closed/thick. Given pitcher of water; ctx significantly decreased after PO fluids & pt reports improvement in symptoms.   Wet prep negative but discharge consistent with yeast infection. Will rx terazol.  U/a with some leuks. Pt reports ?dysuria. Will  Culture urine.  Assessment: 1. Abdominal pain during pregnancy in second trimester   2. [redacted] weeks gestation of pregnancy   3. Vaginal yeast infection   4. Supervision of other normal  pregnancy, antepartum     Plan: Discharge home in stable condition.  Rx terazol GC/CT & urine culture pending PTL precautions    Allergies as of 11/24/2019   No Known Allergies     Medication List    STOP taking these medications   amoxicillin 500 MG capsule Commonly known as: AMOXIL   doxylamine (Sleep) 25 MG tablet Commonly known as: UNISOM   promethazine 25 MG tablet Commonly known as: PHENERGAN   pyridOXINE 25 MG tablet Commonly known as: VITAMIN B-6     TAKE these medications   Comfort Fit Maternity Supp Med Misc 1 Device by Does not apply route daily.   multivitamin-prenatal 27-0.8 MG Tabs tablet Take 1 tablet by mouth daily at 12 noon.   omeprazole 10 MG capsule Commonly known as: PRILOSEC Take 10 mg by mouth daily.   terconazole 0.4 % vaginal cream Commonly known as: TERAZOL 7 Place 1 applicator vaginally at bedtime. Use for seven days What changed: additional instructions       Judeth Horn, NP 11/24/2019 11:23 AM

## 2019-11-25 LAB — CULTURE, OB URINE

## 2019-11-25 LAB — GC/CHLAMYDIA PROBE AMP (~~LOC~~) NOT AT ARMC
Chlamydia: NEGATIVE
Comment: NEGATIVE
Comment: NORMAL
Neisseria Gonorrhea: NEGATIVE

## 2019-12-08 ENCOUNTER — Other Ambulatory Visit: Payer: Self-pay

## 2019-12-08 ENCOUNTER — Ambulatory Visit (INDEPENDENT_AMBULATORY_CARE_PROVIDER_SITE_OTHER): Payer: Self-pay | Admitting: Obstetrics and Gynecology

## 2019-12-08 ENCOUNTER — Encounter: Payer: Self-pay | Admitting: Obstetrics and Gynecology

## 2019-12-08 VITALS — BP 115/76 | HR 80 | Wt 187.2 lb

## 2019-12-08 DIAGNOSIS — Z3480 Encounter for supervision of other normal pregnancy, unspecified trimester: Secondary | ICD-10-CM

## 2019-12-08 DIAGNOSIS — R21 Rash and other nonspecific skin eruption: Secondary | ICD-10-CM

## 2019-12-08 DIAGNOSIS — Z789 Other specified health status: Secondary | ICD-10-CM

## 2019-12-08 DIAGNOSIS — Z3A27 27 weeks gestation of pregnancy: Secondary | ICD-10-CM

## 2019-12-08 NOTE — Progress Notes (Signed)
Pt presents for routine OB visit and 2 gtt labs.  No complaints today per pt Letter for Tdap and flu vaccine given

## 2019-12-08 NOTE — Progress Notes (Signed)
   LOW-RISK PREGNANCY OFFICE VISIT Patient name: Molly Carr MRN [id number]  Date of birth: 1991-01-17 Chief Complaint:   Routine Prenatal Visit  History of Present Illness:   Molly Carr is a 29 y.o. G30P1001 female at [redacted]w[redacted]d with an Estimated Date of Delivery: 03/08/20 being seen today for ongoing management of a low-risk pregnancy.  Today she reports occasional contractions and an itchy rash on inner RT wrist. She reports trying Clobetasol cream on the rash with no relief. She has not tried any other medications. Contractions: Irregular. Vag. Bleeding: None.  Movement: Present. denies leaking of fluid. Review of Systems:   Pertinent items are noted in HPI Denies abnormal vaginal discharge w/ itching/odor/irritation, headaches, visual changes, shortness of breath, chest pain, abdominal pain, severe nausea/vomiting, or problems with urination or bowel movements unless otherwise stated above. Pertinent History Reviewed:  Reviewed past medical,surgical, social, obstetrical and family history.  Reviewed problem list, medications and allergies. Physical Assessment:   Vitals:   12/08/19 0830  BP: 115/76  Pulse: 80  Weight: 187 lb 3.2 oz (84.9 kg)  Body mass index is 33.16 kg/m.        Physical Examination:   General appearance: Well appearing, and in no distress  Mental status: Alert, oriented to person, place, and time  Skin: Warm & dry, RT with quarter-sized round, rough patch on inner wrsit  Cardiovascular: Normal heart rate noted  Respiratory: Normal respiratory effort, no distress  Abdomen: Soft, gravid, nontender  Pelvic: Cervical exam deferred         Extremities: Edema: None  Fetal Status: Fetal Heart Rate (bpm): 152 Fundal Height: 26 cm Movement: Present    No results found for this or any previous visit (from the past 24 hour(s)).  Assessment & Plan:  1) Low-risk pregnancy G2P1001 at [redacted]w[redacted]d with an Estimated Date of Delivery: 03/08/20     2) Supervision of other normal pregnancy, antepartum  - Glucose Tolerance, 2 Hours w/1 Hour,  - CBC,  - RPR,  - HIV Antibody (routine testing w rflx) - Advised that some BH ctxs are normal, but must seek care at MAU if ctxs are more than 6 painful ctxs in 1 hour  3) Language barrier affecting health care - [facility] in-person interpreter Karl Luke used for visit  4) [redacted] weeks gestation of pregnancy - Letter given for patient to get flu and TdaP vaccines at the [facility]  5) Skin rash - of RT inner wrist - Please try using Aveeno lotion and Hydrocortisone cream for the rash on your wrist. You can also take Benadryl 25 mg tablets to help with itching. - Will reassess at nv - If no improvement, can prescribe Triamcinolone ointment or referral to dermatology. Advised patient these options can be very costly for her.   Meds: No orders of the defined types were placed in this encounter.  Labs/procedures today: 2 hr GTT and 3rd trimester labs  Plan:  Continue routine obstetrical care   Reviewed: Preterm labor symptoms and general obstetric precautions including but not limited to vaginal bleeding, contractions, leaking of fluid and fetal movement were reviewed in detail with the patient.  All questions were answered. Check bp weekly, let us know if >140/90.   Follow-up: Return in about 3 weeks (around 12/29/2019) for Return OB visit.  No orders of the defined types were placed in this encounter.  Raelyn Mora MSN, CNM 12/08/2019 9:00 AM

## 2019-12-08 NOTE — Patient Instructions (Addendum)
Intente usar la locin Aveeno y la crema de hidrocortisona para el sarpullido en la [place]. Tambin puede tomar Benadryl comprimidos de 25 mg para Associate Professor.

## 2019-12-09 LAB — CBC
Hematocrit: 32 % — ABNORMAL LOW (ref 34.0–46.6)
Hemoglobin: 10.4 g/dL — ABNORMAL LOW (ref 11.1–15.9)
MCH: 28 pg (ref 26.6–33.0)
MCHC: 32.5 g/dL (ref 31.5–35.7)
MCV: 86 fL (ref 79–97)
Platelets: 322 10*3/uL (ref 150–450)
RBC: 3.71 x10E6/uL — ABNORMAL LOW (ref 3.77–5.28)
RDW: 13.6 % (ref 11.7–15.4)
WBC: 10.1 10*3/uL (ref 3.4–10.8)

## 2019-12-09 LAB — RPR: RPR Ser Ql: NONREACTIVE

## 2019-12-09 LAB — GLUCOSE TOLERANCE, 2 HOURS W/ 1HR
Glucose, 1 hour: 182 mg/dL — ABNORMAL HIGH (ref 65–179)
Glucose, 2 hour: 156 mg/dL — ABNORMAL HIGH (ref 65–152)
Glucose, Fasting: 92 mg/dL — ABNORMAL HIGH (ref 65–91)

## 2019-12-09 LAB — HIV ANTIBODY (ROUTINE TESTING W REFLEX): HIV Screen 4th Generation wRfx: NONREACTIVE

## 2019-12-10 ENCOUNTER — Other Ambulatory Visit: Payer: Self-pay | Admitting: Obstetrics and Gynecology

## 2019-12-10 DIAGNOSIS — O24410 Gestational diabetes mellitus in pregnancy, diet controlled: Secondary | ICD-10-CM

## 2019-12-10 MED ORDER — ACCU-CHEK GUIDE W/DEVICE KIT: 1.0000 | PACK | Freq: Four times a day (QID) | 0 refills | Status: DC

## 2019-12-10 MED ORDER — ACCU-CHEK SOFTCLIX LANCETS MISC: 12 refills | Status: DC

## 2019-12-10 NOTE — Progress Notes (Signed)
Msg sent to clinical pool to notify patient

## 2019-12-11 ENCOUNTER — Telehealth: Payer: Self-pay

## 2019-12-11 ENCOUNTER — Other Ambulatory Visit: Payer: Self-pay

## 2019-12-11 DIAGNOSIS — O24410 Gestational diabetes mellitus in pregnancy, diet controlled: Secondary | ICD-10-CM

## 2019-12-11 MED ORDER — ACCU-CHEK GUIDE VI STRP: ORAL_STRIP | 12 refills | Status: DC

## 2019-12-11 NOTE — Telephone Encounter (Signed)
Discussed results with Spanish Interpreter [phone] Pt made aware of results and that supplies were sent Pt made aware she will need GDM teaching and to expect a call for appt.  Pt asked question and voiced concerns Pt voiced understanding.

## 2019-12-11 NOTE — Telephone Encounter (Signed)
-----   Message from Raelyn Mora, [place] sent at 12/10/2019  5:15 PM EDT ----- Please contact this patient to notify her of her gestational diabetes diagnosis. I will send Rx for meter and supplies.

## 2019-12-22 ENCOUNTER — Other Ambulatory Visit: Payer: Self-pay

## 2019-12-24 ENCOUNTER — Encounter: Payer: Self-pay | Attending: Obstetrics and Gynecology | Admitting: Registered"

## 2019-12-24 ENCOUNTER — Ambulatory Visit: Payer: Self-pay | Admitting: Registered"

## 2019-12-24 ENCOUNTER — Other Ambulatory Visit: Payer: Self-pay

## 2019-12-24 DIAGNOSIS — O24410 Gestational diabetes mellitus in pregnancy, diet controlled: Secondary | ICD-10-CM | POA: Insufficient documentation

## 2019-12-24 DIAGNOSIS — O24419 Gestational diabetes mellitus in pregnancy, unspecified control: Secondary | ICD-10-CM

## 2019-12-24 DIAGNOSIS — Z3A00 Weeks of gestation of pregnancy not specified: Secondary | ICD-10-CM | POA: Insufficient documentation

## 2019-12-24 NOTE — Progress Notes (Signed)
Interpreter services provided by [facility] in-person and last 15 min by Ever #[id number] from [place] video  Patient was seen on 12/24/19 for Gestational Diabetes self-management. EDD 03/08/20. Patient states no history of GDM. Diet history obtained. Patient eats variety of all food groups. Beverages include water, milk, juice, soda/regular and diet.   The following learning objectives were met by the patient :   States the definition of Gestational Diabetes  States why dietary management is important in controlling blood glucose  Describes the effects of carbohydrates on blood glucose levels  Demonstrates ability to create a balanced meal plan  Demonstrates carbohydrate counting   States when to check blood glucose levels  Demonstrates proper blood glucose monitoring techniques  States the effect of stress and exercise on blood glucose levels  States the importance of limiting caffeine and abstaining from alcohol and smoking  Plan:  Aim for 3 Carbohydrate Choices per meal (45 grams) +/- 1 either way  Aim for 1-2 Carbohydrate Choices per snack Begin reading food labels for Total Carbohydrate of foods If OK with your MD, consider  increasing your activity level by walking, Arm Chair Exercises or other activity daily as tolerated Begin checking Blood Glucose before breakfast and 2 hours after first bite of breakfast, lunch and dinner as directed by MD  Bring Log Book/Sheet and meter to every medical appointment  Baby Scripts: Patient not appropriate for Baby Scripts due to language barrier. Take medication if directed by MD  Blood glucose monitor given: Prodigy Lot # [id number] CBG: 97 mg/dL  Patient instructed to monitor glucose levels: FBS: 60 - 95 mg/dl 2 hour: <120 mg/dl  Patient received the following handouts:  Nutrition Diabetes and Pregnancy  Carbohydrate Counting List  Blood glucose Log Sheet  Patient will be seen for follow-up as needed.

## 2019-12-28 ENCOUNTER — Other Ambulatory Visit: Payer: Self-pay

## 2019-12-29 ENCOUNTER — Ambulatory Visit (INDEPENDENT_AMBULATORY_CARE_PROVIDER_SITE_OTHER): Payer: Self-pay | Admitting: Advanced Practice Midwife

## 2019-12-29 ENCOUNTER — Other Ambulatory Visit: Payer: Self-pay

## 2019-12-29 VITALS — BP 122/78 | HR 86 | Wt 189.6 lb

## 2019-12-29 DIAGNOSIS — Z758 Other problems related to medical facilities and other health care: Secondary | ICD-10-CM

## 2019-12-29 DIAGNOSIS — O24415 Gestational diabetes mellitus in pregnancy, controlled by oral hypoglycemic drugs: Secondary | ICD-10-CM

## 2019-12-29 DIAGNOSIS — Z789 Other specified health status: Secondary | ICD-10-CM

## 2019-12-29 DIAGNOSIS — Z3A30 30 weeks gestation of pregnancy: Secondary | ICD-10-CM

## 2019-12-29 DIAGNOSIS — L240 Irritant contact dermatitis due to detergents: Secondary | ICD-10-CM

## 2019-12-29 DIAGNOSIS — Z3480 Encounter for supervision of other normal pregnancy, unspecified trimester: Secondary | ICD-10-CM

## 2019-12-29 DIAGNOSIS — O24419 Gestational diabetes mellitus in pregnancy, unspecified control: Secondary | ICD-10-CM

## 2019-12-29 MED ORDER — METFORMIN HCL 500 MG PO TABS: 500.0000 mg | ORAL_TABLET | Freq: Every day | ORAL | 5 refills | Status: DC

## 2019-12-29 MED ORDER — CLOBETASOL PROPIONATE 0.05 % EX OINT: 1.0000 "application " | TOPICAL_OINTMENT | Freq: Two times a day (BID) | CUTANEOUS | 0 refills | Status: DC

## 2019-12-29 NOTE — Progress Notes (Signed)
ROB 30w  CC: None   Pt brought blood sugars to today's visit.

## 2019-12-29 NOTE — Patient Instructions (Signed)
Diabetes mellitus gestacional, cuidados personales Gestational Diabetes Mellitus, Self Care Las mujeres que tienen diabetes gestacional (diabetes mellitus gestacional) deben mantener su nivel de azcar en la sangre (glucosa) dentro de un rango saludable. Es posible hacerlo por medio de lo siguiente:  Alimentacin.  Actividad fsica.  Cambios en el estilo de vida.  Medicamentos o insulina, si es necesario.  [employer] mdicos y de Producer, television/film/video. Si recibe tratamiento para esta afeccin, es posible que ni usted ni su beb en gestacin (feto) se vean afectados. Si no recibe tratamiento, esta afeccin puede causar problemas que pueden ser perjudiciales para usted o su beb en gestacin. Si tiene diabetes gestacional:  Es ms probable que vuelva a tenerla si queda embarazada nuevamente.  Es ms probable que desarrolle diabetes tipo2 en el futuro. Cmo mantenerse informada sobre Retail buyer de azcar en la sangre   Controle su nivel de azcar en la sangre todos los das durante el [place]. Haga los controles con la frecuencia que le hayan indicado.  Llame al mdico si el nivel de azcar en la sangre est por encima de las cifras ideales en dosanlisis seguidos. El mdico fijar objetivos de tratamiento personales para usted. Generalmente, los [employer] niveles de Location manager en la sangre deben ser los siguientes:  Antes de las comidas, o despus de no haber comido durante un tiempo prolongado (en ayunas o preprandial): igual o menor que 95 mg/dl (5,3 mmol/l).  Despus de las comidas (posprandial): ? Una hora despus de una comida: igual o menor que 128m/dl (7,834ml/l). ? Dos horas despus de una comida: igual o menor que 1204ml (6,7mm58ml).  Nivel de A1c (hemoglobinaA1c): del 6% al 6,5%. Cmo controlar los niveles altos y bajos de azcaLocation managerla sangre Signos de un nivel alto de azcar en la sangre Un nivel alto de azcar en la sangre se denomina hiperglucemia. Conozca  cules son los signos de un nivel alto de azcaDispensing opticians signos pueden incluir lo siguiente:  Sentir: ? Sed. ? Hambre. ? Mucho cansancio.  Necesidad de orinGarment/textile technologist mayor frecuencia que lo habitual.  Visin borrosa. Signos de un nivel bajo de azcar en la sangre Un nivel bajo de azcar en la sangre se denomina hipoglucemia. Este cuadro ocurre cuando el nivel de azcar en la sangre es igual o menor que 70mg33m(3,9mmol37m. Los signos pueden incluir lo siguiente:  Sentir: ? Hambre. ? Preocupacin o nervios (ansiedad). ? Sudoracin y piel hIntel Corporationnfusin. ? Mareos. ? Somnolencia. ? Ganas de vomitar (nuseas).  Tener: ? Latidos cardacos acelerados. ? Dolor de cabezaNetherlandsmbios en la visin. ? Hormigueo y falta de sensibilidad (entumecimiento) alrededor de la boca, labios o lengua. ? Movimientos espasmdicos que no puede controlar (convulsiones).  Dificultades para hacer lo siguiente: ? Moverse (coordinacin). ? Dormir. ? Desmayos. ? Molestarse con facilidad (irritabilidad). Tratamiento del nivel bajo de azcar en la sangre Para tratar un nivel bajo de azcar en la sangre, ingiera un alimento o una bebida azucarada de inmediato. Si puede pensar con claridad y tragar de manera segura, siga la regla 15/15, que consiste en lo siguiente:  Consuma 15gramos de un hidrato de carbono de accin rpida (carbohidrato). Hable con su mdico acerca de cunto debera consumir.  Algunos hidratos de carbono de accin rpida son: ? Comprimidos de azcar (pastillas de glucosa). Consuma 3o 4pastillas de glucosa. ? De 6 a 8unidades de caramelos duros. ? De 4 a 6onzas (de 120 a 150ml) 77mugo de frutas. ?  De 4 a 6onzas (de 120 a 134m) de refresco comn (no diettico). ? 1 cucharada (163m de miel o azcar.  Contrlese el nivel de azcar en la sangre 1536mtos despus de ingerir el hidrato de carbono.  Si el nivel de azcar en la sangre todava es igual o menor que  20m8m (3,9mmo25m), ingiera nuevamente 15gramos de un hidrato de carbono.  Si el nivel de azcar en la sangre no supera los 20mg/37m3,9mmol/35mdespus de 3intentos, solicite ayuda de inmediato.  Ingiera una comida o una colacin en el transcurso de 1hora despus de que el nivel de azcar en la sangre se haya normalizado. Tratamiento del nivel muy bajo de azcar en la sangre Si el nivel de azcar en la sangre es igual o menor que 54mg/dl48mmol/l)41mignifica que est muy bajo (hipoglucemia grave). Esto es una emergEngineer, maintenance (IT)re a ver si los sntomas desaparecen. Solicite atencin mdica de inmediato. Comunquese con el servicio de emergencias de su localidad (911 en los [place]). Si su nivel de azcar en la sangre es muy bajo y no puede ingerir ningn alimento ni bebida, tal vez deba aplicarse una inyeccin de glucagn. Un familiar o un amigo deben aprender a controlarle el azcar en la sangre y a aplicarle una inyeccin de glucagn. Pregntele al mdico si debe tener un kit de inyecciones de glucagn en su casa. Siga estas indicaciones en su casa: Medicamentos  Aplquese la insulina y tome los medicamentos para la diabetes como se lo hayan indicado.  Si el mdico le indica que se aplique ms o menos insulina, o que tome ms o menos medicamentos, haga exactamente lo que le diga. LandAmerica Financialquede sin insulina o sin medicamentos. Alimentos   Opte por opciones de alimentos saludables. Estos incluyen los siguientes: ? Pollo, pescado, claras de huevos y frijoles. ? Avena, harina integral, trigo burgol, arroz integral, quinua y mijo. ? Frutas y Lambert Modyas frescas. ? Productos lcteos descremados. ? Frutos secos, aguacate, aceite de oliva y aceite de canola.  Consulte a un especialista en alimentacin (nutricionista). Este profesional puede ayudarla a elaborar Paediatric nursede alimentacin adecuado para usted.  Siga las indicaciones del mdico respecto de lo que no puede comer o  beber.  Beba suficiente lquido para mantener Contractororina) de color amarillo plido.  Ingiera refrigerios saludables entre comidas nutritivas.  Lleve un registro de los hidratos de carbono que consume. Para hacerlo, lea las etiquetas de informacin nutricional y aprenda cules son los tamaos de las porciones de los alimentos.  Siga su plan para los das de enfermedad cuando no pueda comer ni beber normalmente. Elabore eUnited Auto el mdico, de modo que est listo para usarlo. Actividad  Haga actividad fsica durante 30minutos34ms por da, o durante el tiempo que el mdico le rViacome.  Hable con el mdico antes de comenzar una rutina de ejercicio o actividad nueva. Es posible que el mdico le indique que haga cambios en: ? La cantidad de insulina qDover Corporation o los medicamentos que toma. ? Cunto debe comer. Estilo de vida  No beba alcohol.  No use productos que contengan tabaco. Estos incluyen cigarrillos, tabaco para mascar y cigarrilloPsychologist, sport and exerciseita ayuda para dejar de fumar, consulte al mdico.  Aprenda cmo sobrellevar el estrs. Si necesita ayuda para lograrlo, consulte al mdico. CuiMeadWestvacodel cuerpo  Mantngase al da con las vacunas (inmunizaciones).  Cepllese los dientes y las encas Rattanlo dental unaArdelia Mems  o ms veces por da.  Vaya al dentista una vez cada 78mses o con ms frecuencia.  Mantenga un peso sTax adviser Indicaciones generales  TDelphide venta libre y los recetados solamente como se lo haya indicado el mdico.  Pregntele al mdico sobre los riesgos de la presin arterial alta en el embarazo (preeclampsia y eclampsia).  Comparta su plan de atencin de la diabetes con: ? Sus compaeros de trabajo o de la escuela. ? LAnadarko Petroleum Corporationcon las que cNelliston  Hgase pruebas de orina para dProduct managerpresencia de cetonas: ? Cuando est enferma. ? Como se lo haya indicado el  mdico.  Lleve consigo una tarjeta, o use un brazalete o una medalla que indique que tiene diabetes.  Concurra a todas las visitas de control como se lo haya indicado el mdico. Esto es importante. Cuidados despus del parto  Hgase controlar el nivel de azcar en la sangre 4 a 12semanas despus del parto.  Hgase controlar si tiene diabetes una o ms veces en el plazo de 3 aos. Preguntas para hacerle al mdico  Es necesario que me rena con uRadio broadcast assistanten el cuidado de la diabetes?  Dnde puedo encontrar un grupo de a62para mujeres con diabetes gestacional? Dnde buscar ms informacin Para obtener ms informacin sobre la diabetes, visite los siguientes sitios web:  American Diabetes Association (Asociacin Estadounidense de la Diabetes): www.diabetes.org  Centers for Disease Control and Prevention (Librarian, academic (Centros para eBuilding surveyory la Prevencin de EArboriculturist: whttp://www.wolf.info/Resumen  Controle su nivel de azcar en la sangre (glucosa) tAlton Haga los controles con la frecuencia que le hayan indicado.  Aplquese la insulina y tome los medicamentos para la diabetes como se lo hayan indicado.  Concurra a todas las visitas de control como se lo haya indicado el mdico. Esto es importante.  Hgase controlar el nivel de azcar en la sangre 4 a 12semanas despus del parto. Esta informacin no tiene cMarine scientistel consejo del mdico. Asegrese de hacerle al mdico cualquier pregunta que tenga. Document Revised: 10/31/2017 Document Reviewed: 10/31/2017 Elsevier Patient Education  [place]

## 2019-12-29 NOTE — Progress Notes (Addendum)
   PRENATAL VISIT NOTE  Subjective:  Molly Carr is a 29 y.o. G2P1001 at [redacted]w[redacted]d being seen today for ongoing prenatal care.  She is currently monitored for the following issues for this low-risk pregnancy and has Supervision of other normal pregnancy, antepartum and Gestational diabetes mellitus (GDM), antepartum on their problem list.  Patient reports occasional contractions.  Contractions: Not present. Vag. Bleeding: None.  Movement: Present. Denies leaking of fluid.   The following portions of the patient's history were reviewed and updated as appropriate: allergies, current medications, past family history, past medical history, past social history, past surgical history and problem list.   Objective:   Vitals:   12/29/19 1051  BP: 122/78  Pulse: 86  Weight: 189 lb 9.6 oz (86 kg)    Fetal Status: Fetal Heart Rate (bpm): 154 Fundal Height: 29 cm Movement: Present     General:  Alert, oriented and cooperative. Patient is in no acute distress.  Skin: Skin is warm and dry. No rash noted.   Cardiovascular: Normal heart rate noted  Respiratory: Normal respiratory effort, no problems with respiration noted  Abdomen: Soft, gravid, appropriate for gestational age.  Pain/Pressure: Present     Pelvic: Cervical exam deferred        Extremities: Normal range of motion.  Edema: None  Mental Status: Normal mood and affect. Normal behavior. Normal judgment and thought content.   Assessment and Plan:  Pregnancy: G2P1001 at [redacted]w[redacted]d 1. Supervision of other normal pregnancy, antepartum --Anticipatory guidance about next visits/weeks of pregnancy given. --Next visit in 2 weeks with MD  2. Language barrier affecting health care --Spanish interpreter used for all communication  3. Gestational diabetes mellitus (GDM), antepartum, gestational diabetes method of control unspecified --4 out of 5 fastings above 95, range 90-115 with one outlier at 151; 5 out of 13 PP elevated, range  86-138, with outlier at 188. --With >30% elevated glucose values, will start patient on lowest dose of Metformin, 500 mg daily with evening meal.  Follow up in 2 weeks to adjust PRN.  - metFORMIN (GLUCOPHAGE) 500 MG tablet; Take 1 tablet (500 mg total) by mouth daily with supper.  Dispense: 30 tablet; Refill: 5  4. [redacted] weeks gestation of pregnancy  5. Gestational diabetes mellitus (GDM) in third trimester controlled on oral hypoglycemic drug  6. Irritant contact dermatitis due to detergent --Refractory to hydrocortisone cream - clobetasol ointment (TEMOVATE) 0.05 %; Apply 1 application topically 2 (two) times daily.  Dispense: 30 g; Refill: 0  Preterm labor symptoms and general obstetric precautions including but not limited to vaginal bleeding, contractions, leaking of fluid and fetal movement were reviewed in detail with the patient. Please refer to After Visit Summary for other counseling recommendations.   Return in about 2 weeks (around 01/12/2020).  Future Appointments  Date Time Provider Department Center  01/12/2020  8:45 AM Johnny Bridge, MD [facility] None    Sharen Counter, CNM

## 2019-12-29 NOTE — Addendum Note (Signed)
Addended by: Sharen Counter A on: 12/29/2019 02:19 PM   Modules accepted: Orders

## 2020-01-12 ENCOUNTER — Ambulatory Visit (INDEPENDENT_AMBULATORY_CARE_PROVIDER_SITE_OTHER): Payer: Self-pay | Admitting: Obstetrics and Gynecology

## 2020-01-12 ENCOUNTER — Encounter: Payer: Self-pay | Admitting: Obstetrics and Gynecology

## 2020-01-12 ENCOUNTER — Other Ambulatory Visit: Payer: Self-pay

## 2020-01-12 VITALS — BP 119/76 | HR 85 | Wt 193.0 lb

## 2020-01-12 DIAGNOSIS — O24415 Gestational diabetes mellitus in pregnancy, controlled by oral hypoglycemic drugs: Secondary | ICD-10-CM

## 2020-01-12 DIAGNOSIS — Z3480 Encounter for supervision of other normal pregnancy, unspecified trimester: Secondary | ICD-10-CM

## 2020-01-12 DIAGNOSIS — O0993 Supervision of high risk pregnancy, unspecified, third trimester: Secondary | ICD-10-CM

## 2020-01-12 NOTE — Progress Notes (Signed)
Pt is doing well, has glucose log today.

## 2020-01-12 NOTE — Progress Notes (Signed)
Subjective:  Molly Carr is a 29 y.o. G2P1001 at [redacted]w[redacted]d being seen today for ongoing prenatal care.  She is currently monitored for the following issues for this high-risk pregnancy and has Supervision of other normal pregnancy, antepartum and Gestational diabetes mellitus (GDM), antepartum on their problem list.  GDM: Patient taking metformin 500 mg qhs.  Reports no hypoglycemic episodes.  Tolerating medication well Fasting: 86 - 118.  Only 4 out of 14 readings abnormal since starting metformin 2hr PP: Breakfast= 83 - 141, 3/14 readings abnormal  Lunch= 77 - 154, 3/13 readings abnormal  Dinner= 100 - 180, 8/13 readings abnormal.  Patient reports no complaints.  Contractions: Not present. Vag. Bleeding: None.  Movement: Present. Denies leaking of fluid.   The following portions of the patient's history were reviewed and updated as appropriate: allergies, current medications, past family history, past medical history, past social history, past surgical history and problem list. Problem list updated.  Objective:   Vitals:   01/12/20 0846  BP: 119/76  Pulse: 85  Weight: 193 lb (87.5 kg)    Fetal Status: Fetal Heart Rate (bpm): 155 Fundal Height: 32 cm Movement: Present     General:  Alert, oriented and cooperative. Patient is in no acute distress.  Skin: Skin is warm and dry. No rash noted.   Cardiovascular: Normal heart rate noted  Respiratory: Normal respiratory effort, no problems with respiration noted  Abdomen: Soft, gravid, appropriate for gestational age. Pain/Pressure: Present     Pelvic: Vag. Bleeding: None     Cervical exam deferred        Extremities: Normal range of motion.  Edema: None  Mental Status: Normal mood and affect. Normal behavior. Normal judgment and thought content.   NST:    Assessment and Plan:  Pregnancy: G2P1001 at [redacted]w[redacted]d  1. Supervision of other normal pregnancy, antepartum   2. Gestational diabetes mellitus (GDM) in third  trimester controlled on oral hypoglycemic drug - Patient to continue Metformin 500 mg qhs.   - She will start walking before meals. - Will start weekly antenatal testing. - Will order serial ultrasounds for growth.    Preterm labor symptoms and general obstetric precautions including but not limited to vaginal bleeding, contractions, leaking of fluid and fetal movement were reviewed in detail with the patient. Please refer to After Visit Summary for other counseling recommendations.  Return in about 2 weeks (around 01/26/2020) for HROB + scedule weekly NSTs.   Johnny Bridge, MD

## 2020-01-12 NOTE — Patient Instructions (Signed)
Exercise During Pregnancy Exercise is an important part of being healthy for people of all ages. Exercise improves the function of your heart and lungs and helps you maintain strength, flexibility, and a healthy body weight. Exercise also boosts energy levels and elevates mood. Most women should exercise regularly during pregnancy. In rare cases, women with certain medical conditions or complications may be asked to limit or avoid exercise during pregnancy. How does this affect me? Along with maintaining general strength and flexibility, exercising during pregnancy can help:  Keep strength in muscles that are used during labor and childbirth.  Decrease low back pain.  Reduce symptoms of depression.  Control weight gain during pregnancy.  Reduce the risk of needing insulin if you develop diabetes during pregnancy.  Decrease the risk of cesarean delivery.  Speed up your recovery after giving birth. How does this affect my baby? Exercise can help you have a healthy pregnancy. Exercise does not cause premature birth. It will not cause your baby to weigh less at birth. What exercises can I do? Many exercises are safe for you to do during pregnancy. Do a variety of exercises that safely increase your heart and breathing rates and help you build and maintain muscle strength. Do exercises exactly as told by your health care provider. You may do these exercises:  Walking or hiking.  Swimming.  Water aerobics.  Riding a stationary bike.  Strength training.  Modified yoga or Pilates. Tell your instructor that you are pregnant. Avoid overstretching, and avoid lying on your back for long periods of time.  Running or jogging. Only choose this type of exercise if you: ? Ran or jogged regularly before your pregnancy. ? Can run or jog and still talk in complete sentences. What exercises should I avoid? Depending on your level of fitness and whether you exercised regularly before your  pregnancy, you may be told to limit high-intensity exercise. You can tell that you are exercising at a high intensity if you are breathing much harder and faster and cannot hold a conversation while exercising. You must avoid:  Contact sports.  Activities that put you at risk for falling on or being hit in the belly, such as downhill skiing, water skiing, surfing, rock climbing, cycling, gymnastics, and horseback riding.  Scuba diving.  Skydiving.  Yoga or Pilates in a room that is heated to high temperatures.  Jogging or running, unless you ran or jogged regularly before your pregnancy. While jogging or running, you should always be able to talk in full sentences. Do not run or jog so fast that you are unable to have a conversation.  Do not exercise at more than 6,000 feet above sea level (high elevation) if you are not used to exercising at high elevation. How do I exercise in a safe way?   Avoid overheating. Do not exercise in very high temperatures.  Wear loose-fitting, breathable clothes.  Avoid dehydration. Drink enough water before, during, and after exercise to keep your urine pale yellow.  Avoid overstretching. Because of hormone changes during pregnancy, it is easy to overstretch muscles, tendons, and ligaments during pregnancy.  Start slowly and ask your health care provider to recommend the types of exercise that are safe for you.  Do not exercise to lose weight. Follow these instructions at home:  Exercise on most days or all days of the week. Try to exercise for 30 minutes a day, 5 days a week, unless your health care provider tells you not to.  If   you actively exercised before your pregnancy and you are healthy, your health care provider may tell you to continue to do moderate to high-intensity exercise.  If you are just starting to exercise or did not exercise much before your pregnancy, your health care provider may tell you to do low to moderate-intensity  exercise. Questions to ask your health care provider  Is exercise safe for me?  What are signs that I should stop exercising?  Does my health condition mean that I should not exercise during pregnancy?  When should I avoid exercising during pregnancy? Stop exercising and contact a health care provider if: You have any unusual symptoms, such as:  Mild contractions of the uterus or cramps in the abdomen.  Dizziness that does not go away when you rest. Stop exercising and get help right away if: You have any unusual symptoms, such as:  Sudden, severe pain in your low back or your belly.  Mild contractions of the uterus or cramps in the abdomen that do not improve with rest and drinking fluids.  Chest pain.  Bleeding or fluid leaking from your vagina.  Shortness of breath. These symptoms may represent a serious problem that is an emergency. Do not wait to see if the symptoms will go away. Get medical help right away. Call your local emergency services (911 in the [place] Do not drive yourself to the hospital. Summary  Most women should exercise regularly throughout pregnancy. In rare cases, women with certain medical conditions or complications may be asked to limit or avoid exercise during pregnancy.  Do not exercise to lose weight during pregnancy.  Your health care provider will tell you what level of physical activity is right for you.  Stop exercising and contact a health care provider if you have mild contractions of the uterus or cramps in the abdomen. Get help right away if these contractions or cramps do not improve with rest and drinking fluids.  Stop exercising and get help right away if you have sudden, severe pain in your low back or belly, chest pain, shortness of breath, or bleeding or leaking of fluid from your vagina. This information is not intended to replace advice given to you by your health care provider. Make sure you discuss any questions you have with your  health care provider. Document Revised: 07/10/2018 Document Reviewed: 04/23/2018 Elsevier Patient Education  2020 [employer]  

## 2020-01-19 ENCOUNTER — Ambulatory Visit (INDEPENDENT_AMBULATORY_CARE_PROVIDER_SITE_OTHER): Payer: Self-pay

## 2020-01-19 ENCOUNTER — Other Ambulatory Visit: Payer: Self-pay

## 2020-01-19 VITALS — BP 124/79 | HR 90 | Wt 197.9 lb

## 2020-01-19 DIAGNOSIS — O24415 Gestational diabetes mellitus in pregnancy, controlled by oral hypoglycemic drugs: Secondary | ICD-10-CM

## 2020-01-19 DIAGNOSIS — Z3480 Encounter for supervision of other normal pregnancy, unspecified trimester: Secondary | ICD-10-CM

## 2020-01-19 DIAGNOSIS — O0993 Supervision of high risk pregnancy, unspecified, third trimester: Secondary | ICD-10-CM

## 2020-01-19 NOTE — Progress Notes (Signed)
Patient was assessed and managed by nursing staff during this encounter. I have reviewed the chart and agree with the documentation and plan. NST performed today was reviewed and was found to be reactive.  Continue recommended antenatal testing and prenatal care.   Jaynie Collins, MD 01/19/2020 11:14 AM

## 2020-01-19 NOTE — Progress Notes (Signed)
Nurse visit for NST.  Pending U/S completed at [facility] yesterday per pt  Dx: GDM  NST Reactive

## 2020-01-26 ENCOUNTER — Encounter: Payer: Self-pay | Admitting: Obstetrics and Gynecology

## 2020-01-26 ENCOUNTER — Other Ambulatory Visit: Payer: Self-pay

## 2020-01-26 ENCOUNTER — Ambulatory Visit (INDEPENDENT_AMBULATORY_CARE_PROVIDER_SITE_OTHER): Payer: Self-pay | Admitting: Obstetrics and Gynecology

## 2020-01-26 VITALS — BP 121/82 | HR 81 | Wt 201.0 lb

## 2020-01-26 DIAGNOSIS — Z603 Acculturation difficulty: Secondary | ICD-10-CM

## 2020-01-26 DIAGNOSIS — O0993 Supervision of high risk pregnancy, unspecified, third trimester: Secondary | ICD-10-CM

## 2020-01-26 DIAGNOSIS — Z789 Other specified health status: Secondary | ICD-10-CM

## 2020-01-26 DIAGNOSIS — Z3480 Encounter for supervision of other normal pregnancy, unspecified trimester: Secondary | ICD-10-CM

## 2020-01-26 DIAGNOSIS — O24415 Gestational diabetes mellitus in pregnancy, controlled by oral hypoglycemic drugs: Secondary | ICD-10-CM

## 2020-01-26 NOTE — Progress Notes (Signed)
ROB NST  Pt has log of sugars with her.  CC: None

## 2020-01-26 NOTE — Progress Notes (Signed)
Subjective:  Molly Carr is a 29 y.o. G2P1001 at [redacted]w[redacted]d being seen today for ongoing prenatal care.  She is currently monitored for the following issues for this high-risk pregnancy and has Supervision of other normal pregnancy, antepartum and Gestational diabetes mellitus (GDM), antepartum on their problem list.  GDM: Patient taking Metformin 500 mg qhs.  Reports no hypoglycemic episodes.  Tolerating medication well Fasting: 74 - 120, 5/14 abnormal 2hr PP: Breakfast = 72 - 170, 1/14 abnormal; Lunch = 87 - 280, 2/14 abnormal;  Dinner = 96 - 360, 2/14 abnormal (pt had cake for a birthday celebration)  Last growth scan on 01/18/20 showed an growth at 52.8 percentile with a normal AFI.  NST today, baseline 140s, moderate variability, reactive, Category 1 tracing.    Patient reports no complaints.  Contractions: Irritability. Vag. Bleeding: None.  Movement: Present. Denies leaking of fluid.   The following portions of the patient's history were reviewed and updated as appropriate: allergies, current medications, past family history, past medical history, past social history, past surgical history and problem list. Problem list updated.  Objective:   Vitals:   01/26/20 0933  BP: 121/82  Pulse: 81  Weight: 201 lb (91.2 kg)    Fetal Status: Fetal Heart Rate (bpm): NST Fundal Height: 34 cm Movement: Present     General:  Alert, oriented and cooperative. Patient is in no acute distress.  Skin: Skin is warm and dry. No rash noted.   Cardiovascular: Normal heart rate noted  Respiratory: Normal respiratory effort, no problems with respiration noted  Abdomen: Soft, gravid, appropriate for gestational age. Pain/Pressure: Present     Pelvic: Vag. Bleeding: None     Cervical exam deferred        Extremities: Normal range of motion.  Edema: Trace  Mental Status: Normal mood and affect. Normal behavior. Normal judgment and thought content.   Urinalysis:      Assessment and  Plan:  Pregnancy: G2P1001 at [redacted]w[redacted]d  1. Supervision of high risk pregnancy in third trimester - For GBS screening at next appointment.  2. Gestational diabetes mellitus (GDM) in third trimester controlled on oral hypoglycemic drug - Glucose levels markedly improved. - Patient to continue Metformin. - Patient to continue daily walking. - Patient to continue weekly antenatal testing. - Repeat growth scan due at 37 weeks.    3. Language barrier affecting health care - Interpreter present.  Preterm labor symptoms and general obstetric precautions including but not limited to vaginal bleeding, contractions, leaking of fluid and fetal movement were reviewed in detail with the patient. Please refer to After Visit Summary for other counseling recommendations.  Return in about 2 weeks (around 02/09/2020) for HROB.   Johnny Bridge, MD

## 2020-01-26 NOTE — Addendum Note (Signed)
Addended by: Kennon Portela on: 01/26/2020 01:29 PM   Modules accepted: Orders

## 2020-02-02 ENCOUNTER — Ambulatory Visit (INDEPENDENT_AMBULATORY_CARE_PROVIDER_SITE_OTHER): Payer: Self-pay

## 2020-02-02 ENCOUNTER — Other Ambulatory Visit: Payer: Self-pay

## 2020-02-02 VITALS — BP 116/73 | HR 80 | Wt 195.0 lb

## 2020-02-02 DIAGNOSIS — O24415 Gestational diabetes mellitus in pregnancy, controlled by oral hypoglycemic drugs: Secondary | ICD-10-CM

## 2020-02-02 DIAGNOSIS — O0993 Supervision of high risk pregnancy, unspecified, third trimester: Secondary | ICD-10-CM

## 2020-02-02 NOTE — Progress Notes (Signed)
Patient was assessed and managed by nursing staff during this encounter. I have reviewed the chart and agree with the documentation and plan. I have also made any necessary editorial changes.  Kazuma Elena A Kimberley Dastrup, MD 02/02/2020 3:37 PM   

## 2020-02-02 NOTE — Progress Notes (Signed)
NST performed today was reviewed and was found to be reactive.  Continue recommended antenatal testing and prenatal care.  

## 2020-02-09 ENCOUNTER — Encounter: Payer: Self-pay | Admitting: Nurse Practitioner

## 2020-02-09 ENCOUNTER — Other Ambulatory Visit (HOSPITAL_COMMUNITY)
Admission: RE | Admit: 2020-02-09 | Discharge: 2020-02-09 | Disposition: A | Discharge status: Home / Self Care | Payer: Self-pay | Source: Ambulatory Visit | Attending: Nurse Practitioner | Admitting: Nurse Practitioner

## 2020-02-09 ENCOUNTER — Other Ambulatory Visit: Payer: Self-pay

## 2020-02-09 ENCOUNTER — Ambulatory Visit (INDEPENDENT_AMBULATORY_CARE_PROVIDER_SITE_OTHER): Payer: Self-pay | Admitting: Nurse Practitioner

## 2020-02-09 VITALS — BP 127/79 | HR 90 | Wt 200.0 lb

## 2020-02-09 DIAGNOSIS — O24419 Gestational diabetes mellitus in pregnancy, unspecified control: Secondary | ICD-10-CM

## 2020-02-09 DIAGNOSIS — O0993 Supervision of high risk pregnancy, unspecified, third trimester: Secondary | ICD-10-CM | POA: Insufficient documentation

## 2020-02-09 NOTE — Progress Notes (Signed)
ROB  NST today per notes   Pt forgot blood sugar logs.  Need refill on Meformin.   CC: swelling in hands

## 2020-02-09 NOTE — Progress Notes (Signed)
    Subjective:  Molly Carr is a 29 y.o. G2P1001 at [redacted]w[redacted]d being seen today for ongoing prenatal care.  She is currently monitored for the following issues for this high-risk pregnancy and has Supervision of other normal pregnancy, antepartum and Gestational diabetes mellitus (GDM), antepartum on their problem list.  Patient reports no complaints.  Contractions: Irritability. Vag. Bleeding: None.  Movement: Present. Denies leaking of fluid.   The following portions of the patient's history were reviewed and updated as appropriate: allergies, current medications, past family history, past medical history, past social history, past surgical history and problem list. Problem list updated.  Objective:   Vitals:   02/09/20 1351  BP: 127/79  Pulse: 90  Weight: 200 lb (90.7 kg)    Fetal Status: Fetal Heart Rate (bpm): NST    Movement: Present  Presentation: Vertex  General:  Alert, oriented and cooperative. Patient is in no acute distress.  Skin: Skin is warm and dry. No rash noted.   Cardiovascular: Normal heart rate noted  Respiratory: Normal respiratory effort, no problems with respiration noted  Abdomen: Soft, gravid, appropriate for gestational age. Pain/Pressure: Present     Pelvic:  Cervical exam performed Dilation: Fingertip Effacement (%): Thick Station: Ballotablevaginal swabs done  Extremities: Normal range of motion.  Edema: Trace  Mental Status: Normal mood and affect. Normal behavior. Normal judgment and thought content.   Urinalysis:      Assessment and Plan:  Pregnancy: G2P1001 at [redacted]w[redacted]d  1. Supervision of high risk pregnancy in third trimester NST today is category 1 - Baseline FHT 145 with moderate variability, no decelerations, no contractions, 15x15 accels noted. Considering going out of state - discussed risks of birth out of state - likely will not travel.  Advised stopping every 2 hours to stretch, walk and go to the bathroom if needing to  travel Advised to bring blood sugar record to next visit Had [place] at 33 weeks will schedule again in 1 week  - Fetal nonstress test - Strep Gp B NAA - Cervicovaginal ancillary only( [place])  2. Gestational diabetes mellitus (GDM), antepartum, gestational diabetes method of control unspecified Forgot blood sugar record but states it is about the same as previously Taking Metformin and encouraged to continue - advised there are refills on her current prescription  Preterm labor symptoms and general obstetric precautions including but not limited to vaginal bleeding, contractions, leaking of fluid and fetal movement were reviewed in detail with the patient. Please refer to After Visit Summary for other counseling recommendations.  Return in about 1 week (around 02/16/2020) for in person ROB with NST.  Nolene Bernheim, RN, MSN, NP-BC Nurse Practitioner, [facility] for [employer], [facility] 02/09/2020 2:45 PM

## 2020-02-10 LAB — CERVICOVAGINAL ANCILLARY ONLY
Chlamydia: NEGATIVE
Comment: NEGATIVE
Comment: NEGATIVE
Comment: NORMAL
Neisseria Gonorrhea: NEGATIVE
Trichomonas: NEGATIVE

## 2020-02-11 LAB — STREP GP B NAA: Strep Gp B NAA: POSITIVE — AB

## 2020-02-12 ENCOUNTER — Encounter: Payer: Self-pay | Admitting: Nurse Practitioner

## 2020-02-12 DIAGNOSIS — O99820 Streptococcus B carrier state complicating pregnancy: Secondary | ICD-10-CM | POA: Insufficient documentation

## 2020-02-16 ENCOUNTER — Ambulatory Visit (INDEPENDENT_AMBULATORY_CARE_PROVIDER_SITE_OTHER): Payer: Self-pay | Admitting: Advanced Practice Midwife

## 2020-02-16 ENCOUNTER — Other Ambulatory Visit: Payer: Self-pay

## 2020-02-16 VITALS — BP 124/82 | HR 83 | Wt 201.0 lb

## 2020-02-16 DIAGNOSIS — Z789 Other specified health status: Secondary | ICD-10-CM

## 2020-02-16 DIAGNOSIS — Z3480 Encounter for supervision of other normal pregnancy, unspecified trimester: Secondary | ICD-10-CM

## 2020-02-16 DIAGNOSIS — O0993 Supervision of high risk pregnancy, unspecified, third trimester: Secondary | ICD-10-CM

## 2020-02-16 DIAGNOSIS — O24415 Gestational diabetes mellitus in pregnancy, controlled by oral hypoglycemic drugs: Secondary | ICD-10-CM

## 2020-02-16 DIAGNOSIS — Z3A37 37 weeks gestation of pregnancy: Secondary | ICD-10-CM

## 2020-02-16 NOTE — Progress Notes (Signed)
   PRENATAL VISIT NOTE  Subjective:  Molly Carr is a 29 y.o. G2P1001 at [redacted]w[redacted]d being seen today for ongoing prenatal care.  She is currently monitored for the following issues for this high-risk pregnancy and has Supervision of other normal pregnancy, antepartum; Gestational diabetes mellitus (GDM), antepartum; and GBS (group B Streptococcus carrier), +RV culture, currently pregnant on their problem list.  Patient reports occasional contractions.  Contractions: Irregular. Vag. Bleeding: None.  Movement: Present. Denies leaking of fluid.   The following portions of the patient's history were reviewed and updated as appropriate: allergies, current medications, past family history, past medical history, past social history, past surgical history and problem list.   Objective:   Vitals:   02/16/20 0853  BP: 124/82  Pulse: 83  Weight: 201 lb (91.2 kg)    Fetal Status: Fetal Heart Rate (bpm): NST   Movement: Present     General:  Alert, oriented and cooperative. Patient is in no acute distress.  Skin: Skin is warm and dry. No rash noted.   Cardiovascular: Normal heart rate noted  Respiratory: Normal respiratory effort, no problems with respiration noted  Abdomen: Soft, gravid, appropriate for gestational age.  Pain/Pressure: Present     Pelvic: Cervical exam deferred        Extremities: Normal range of motion.  Edema: Trace  Mental Status: Normal mood and affect. Normal behavior. Normal judgment and thought content.   Assessment and Plan:  Pregnancy: G2P1001 at [redacted]w[redacted]d 1. Supervision of high risk pregnancy in third trimester --Anticipatory guidance about next visits/weeks of pregnancy given. --Pt with good fetal movement, NST reactive today, BPP on Thursday with MFM  --IOL at 39 weeks, scheduled December 1, orders placed --Next appt in 1 week in office  2. Gestational diabetes mellitus (GDM) in third trimester controlled on oral hypoglycemic drug --Glucose well  controlled on metformin --Fastings with non elevated in 2 weeks, PP with 1-2 elevated out of 20+ values.  3. Language barrier affecting health care --spanish interpreter used for all communication  4. [redacted] weeks gestation of pregnancy   Term labor symptoms and general obstetric precautions including but not limited to vaginal bleeding, contractions, leaking of fluid and fetal movement were reviewed in detail with the patient. Please refer to After Visit Summary for other counseling recommendations.   Return in about 1 week (around 02/23/2020).  Future Appointments  Date Time Provider Department Center  02/23/2020  3:00 PM Sharyon Cable, CNM [facility] None    Sharen Counter, CNM

## 2020-02-16 NOTE — Patient Instructions (Addendum)
Things to Try After 37 weeks to Encourage Labor/Get Ready for Labor:   1.  Try the Miles Circuit at www.milescircuit.com daily to improve baby's position and encourage the onset of labor.  2. Walk a little and rest a little every day.  Change positions often.  3. Cervical Ripening: May try one or both a. Red Raspberry Leaf capsules or tea:  two 300mg or 400mg tablets with each meal, 2-3 times a day, or 1-3 cups of tea daily  Potential Side Effects Of Raspberry Leaf:  Most women do not experience any side effects from drinking raspberry leaf tea. However, nausea and loose stools are possible   b. Evening Primrose Oil capsules: may take 1 to 3 capsules daily. Take 1-2 capsules by mouth each day and place one capsule vaginally at night.  You may also prick the vaginal capsule to release the oil prior to inserting in the vagina. Some of the potential side effects:  Upset stomach  Loose stools or diarrhea  Headaches  Nausea  4. Sex (and especially sex with orgasm) can also help the cervix ripen and encourage labor onset.  Labor Precautions Reasons to come to [facility] at [facility]:  1.  Contractions are  5 minutes apart or less, each last 1 minute, these have been going on for 1-2 hours, and you cannot walk or talk during them 2.  You have a large gush of fluid, or a trickle of fluid that will not stop and you have to wear a pad 3.  You have bleeding that is bright red, heavier than spotting--like menstrual bleeding (spotting can be normal in early labor or after a check of your cervix) 4.  You do not feel the baby moving like he/she normally does 

## 2020-02-17 ENCOUNTER — Telehealth (HOSPITAL_COMMUNITY): Payer: Self-pay | Admitting: *Deleted

## 2020-02-17 NOTE — Telephone Encounter (Signed)
Preadmission screen Interpreter number [phone]

## 2020-02-18 ENCOUNTER — Encounter (HOSPITAL_COMMUNITY): Payer: Self-pay | Admitting: *Deleted

## 2020-02-18 ENCOUNTER — Telehealth (HOSPITAL_COMMUNITY): Payer: Self-pay | Admitting: *Deleted

## 2020-02-18 NOTE — Telephone Encounter (Signed)
Interpreter number [phone]

## 2020-02-19 NOTE — Addendum Note (Signed)
Addended by: Marya Landry D on: 02/19/2020 11:26 AM   Modules accepted: Orders

## 2020-02-23 ENCOUNTER — Other Ambulatory Visit: Payer: Self-pay

## 2020-02-23 ENCOUNTER — Encounter: Payer: Self-pay | Admitting: Certified Nurse Midwife

## 2020-02-23 ENCOUNTER — Ambulatory Visit (INDEPENDENT_AMBULATORY_CARE_PROVIDER_SITE_OTHER): Payer: Self-pay | Admitting: Certified Nurse Midwife

## 2020-02-23 VITALS — BP 124/86 | HR 83 | Wt 208.0 lb

## 2020-02-23 DIAGNOSIS — Z3480 Encounter for supervision of other normal pregnancy, unspecified trimester: Secondary | ICD-10-CM

## 2020-02-23 DIAGNOSIS — O24415 Gestational diabetes mellitus in pregnancy, controlled by oral hypoglycemic drugs: Secondary | ICD-10-CM

## 2020-02-23 DIAGNOSIS — O99820 Streptococcus B carrier state complicating pregnancy: Secondary | ICD-10-CM

## 2020-02-23 NOTE — Patient Instructions (Signed)
Razones para volver a MAU:  1. Las contracciones son cada 5 minutos o menos, cada uno de los ltimos 1 minuto, stos han llevado a cabo durante 1-2 horas, y no se puede caminar o hablar durante ellas 2. Usted tiene un gran chorro de lquido o un goteo de lquido que no se detiene y hay que usar una toalla 3. Ha de sangrado que es de color rojo brillante, denso que el manchado - como sangrado menstrual (manchado puede ser normal en trabajo de parto prematuro o despus de una comprobacin de su cuello uterino) 4. Usted no se siente el beb se mueve como l / ella hace normalmente 

## 2020-02-23 NOTE — Progress Notes (Signed)
   PRENATAL VISIT NOTE  Subjective:  Molly Carr is a 29 y.o. G2P1001 at [redacted]w[redacted]d being seen today for ongoing prenatal care.  She is currently monitored for the following issues for this high-risk pregnancy and has Supervision of other normal pregnancy, antepartum; Gestational diabetes mellitus (GDM), antepartum; and GBS (group B Streptococcus carrier), +RV culture, currently pregnant on their problem list.  Patient reports occasional contractions.  Contractions: Irregular. Vag. Bleeding: None.  Movement: Present. Denies leaking of fluid.   The following portions of the patient's history were reviewed and updated as appropriate: allergies, current medications, past family history, past medical history, past social history, past surgical history and problem list.   Objective:   Vitals:   02/23/20 1426  BP: 124/86  Pulse: 83  Weight: 208 lb (94.3 kg)    Fetal Status: Fetal Heart Rate (bpm): 141 Fundal Height: 34 cm Movement: Present     General:  Alert, oriented and cooperative. Patient is in no acute distress.  Skin: Skin is warm and dry. No rash noted.   Cardiovascular: Normal heart rate noted  Respiratory: Normal respiratory effort, no problems with respiration noted  Abdomen: Soft, gravid, appropriate for gestational age.  Pain/Pressure: Present     Pelvic: Cervical exam deferred        Extremities: Normal range of motion.  Edema: Trace  Mental Status: Normal mood and affect. Normal behavior. Normal judgment and thought content.   Assessment and Plan:  Pregnancy: G2P1001 at [redacted]w[redacted]d 1. Supervision of other normal pregnancy, antepartum - Patient doing well, reports occasional contractions  - Labor precautions and reasons to present to MAU discussed  - Anticipatory guidance on upcoming appointments   2. GBS (group B Streptococcus carrier), +RV culture, currently pregnant - treat during labor   3. Gestational diabetes mellitus (GDM) controlled on oral  hypoglycemic drug, antepartum - CBG log reviewed with patient  - Fasting range from 87-95 with one elevated of 98 since last prenatal appointment  - 2hr PP range from 87-120 with one elevated of 163 since last prenatal appointment  - well controlled on metfomin, encouraged to continue to be diligent on diet especially over the holidays  - follow up US at [facility] at [redacted]w[redacted]d noted EFW 3119g, 53%tile, and AFI level is 13.10cm - NST performed in office today, reactive, patient needs BPP scheduled and or follow up NST   Term labor symptoms and general obstetric precautions including but not limited to vaginal bleeding, contractions, leaking of fluid and fetal movement were reviewed in detail with the patient. Please refer to After Visit Summary for other counseling recommendations.   Return in about 6 days (around 02/29/2020) for HROB, NST, in person.  Future Appointments  Date Time Provider Department Center  02/29/2020  9:30 AM [facility]ENING [facility] None  02/29/2020  1:15 PM Malachy Chamber, MD [facility] None  03/02/2020  7:00 AM [facility] SCHED ROOM [facility] None    Sharyon Cable, CNM

## 2020-02-23 NOTE — Progress Notes (Addendum)
SPANISH INTERPRETER [phone] ANA  ROB, c/o back, abdominal pain 5-8/10 and contractions since last night.  Contractions are 5-10 minutes apart intermittently.  BS Fasting 87-98 after meal 75-101

## 2020-02-27 ENCOUNTER — Other Ambulatory Visit: Payer: Self-pay | Admitting: Advanced Practice Midwife

## 2020-02-28 ENCOUNTER — Inpatient Hospital Stay (HOSPITAL_COMMUNITY): Payer: Medicaid Other | Admitting: Anesthesiology

## 2020-02-28 ENCOUNTER — Other Ambulatory Visit: Payer: Self-pay

## 2020-02-28 ENCOUNTER — Inpatient Hospital Stay (HOSPITAL_COMMUNITY)
Admission: AD | Admit: 2020-02-28 | Discharge: 2020-03-01 | DRG: 807 | Disposition: A | Discharge status: Home / Self Care | Payer: Medicaid Other | Attending: Family Medicine | Admitting: Family Medicine

## 2020-02-28 ENCOUNTER — Encounter (HOSPITAL_COMMUNITY): Payer: Self-pay | Admitting: Family Medicine

## 2020-02-28 DIAGNOSIS — O165 Unspecified maternal hypertension, complicating the puerperium: Secondary | ICD-10-CM | POA: Diagnosis not present

## 2020-02-28 DIAGNOSIS — O26893 Other specified pregnancy related conditions, third trimester: Secondary | ICD-10-CM | POA: Diagnosis present

## 2020-02-28 DIAGNOSIS — O4202 Full-term premature rupture of membranes, onset of labor within 24 hours of rupture: Secondary | ICD-10-CM

## 2020-02-28 DIAGNOSIS — O24415 Gestational diabetes mellitus in pregnancy, controlled by oral hypoglycemic drugs: Secondary | ICD-10-CM | POA: Diagnosis present

## 2020-02-28 DIAGNOSIS — O134 Gestational [pregnancy-induced] hypertension without significant proteinuria, complicating childbirth: Secondary | ICD-10-CM | POA: Diagnosis present

## 2020-02-28 DIAGNOSIS — O24425 Gestational diabetes mellitus in childbirth, controlled by oral hypoglycemic drugs: Secondary | ICD-10-CM | POA: Diagnosis present

## 2020-02-28 DIAGNOSIS — Z20822 Contact with and (suspected) exposure to covid-19: Secondary | ICD-10-CM | POA: Diagnosis present

## 2020-02-28 DIAGNOSIS — Z3A38 38 weeks gestation of pregnancy: Secondary | ICD-10-CM

## 2020-02-28 DIAGNOSIS — O99820 Streptococcus B carrier state complicating pregnancy: Secondary | ICD-10-CM

## 2020-02-28 DIAGNOSIS — K219 Gastro-esophageal reflux disease without esophagitis: Secondary | ICD-10-CM | POA: Diagnosis present

## 2020-02-28 DIAGNOSIS — Z3480 Encounter for supervision of other normal pregnancy, unspecified trimester: Secondary | ICD-10-CM

## 2020-02-28 DIAGNOSIS — O139 Gestational [pregnancy-induced] hypertension without significant proteinuria, unspecified trimester: Secondary | ICD-10-CM | POA: Diagnosis not present

## 2020-02-28 DIAGNOSIS — O99824 Streptococcus B carrier state complicating childbirth: Secondary | ICD-10-CM | POA: Diagnosis present

## 2020-02-28 DIAGNOSIS — Z23 Encounter for immunization: Secondary | ICD-10-CM | POA: Diagnosis not present

## 2020-02-28 DIAGNOSIS — O24435 Gestational diabetes mellitus in puerperium, controlled by oral hypoglycemic drugs: Secondary | ICD-10-CM | POA: Diagnosis not present

## 2020-02-28 DIAGNOSIS — O9962 Diseases of the digestive system complicating childbirth: Secondary | ICD-10-CM | POA: Diagnosis present

## 2020-02-28 DIAGNOSIS — B951 Streptococcus, group B, as the cause of diseases classified elsewhere: Secondary | ICD-10-CM

## 2020-02-28 LAB — COMPREHENSIVE METABOLIC PANEL
ALT: 13 U/L (ref 0–44)
AST: 17 U/L (ref 15–41)
Albumin: 2.3 g/dL — ABNORMAL LOW (ref 3.5–5.0)
Alkaline Phosphatase: 91 U/L (ref 38–126)
Anion gap: 10 (ref 5–15)
BUN: 10 mg/dL (ref 6–20)
CO2: 19 mmol/L — ABNORMAL LOW (ref 22–32)
Calcium: 9.2 mg/dL (ref 8.9–10.3)
Chloride: 107 mmol/L (ref 98–111)
Creatinine, Ser: 0.59 mg/dL (ref 0.44–1.00)
GFR, Estimated: >60 mL/min (ref >60)
Glucose, Bld: 124 mg/dL — ABNORMAL HIGH (ref 70–99)
Potassium: 3.9 mmol/L (ref 3.5–5.1)
Sodium: 136 mmol/L (ref 135–145)
Total Bilirubin: 0.4 mg/dL (ref 0.3–1.2)
Total Protein: 6.3 g/dL — ABNORMAL LOW (ref 6.5–8.1)

## 2020-02-28 LAB — RESP PANEL BY RT-PCR (FLU A&B, COVID) ARPGX2
Influenza A by PCR: NEGATIVE
Influenza B by PCR: NEGATIVE
SARS Coronavirus 2 by RT PCR: NEGATIVE

## 2020-02-28 LAB — CBC
HCT: 34 % — ABNORMAL LOW (ref 36.0–46.0)
Hemoglobin: 10.9 g/dL — ABNORMAL LOW (ref 12.0–15.0)
MCH: 26.5 pg (ref 26.0–34.0)
MCHC: 32.1 g/dL (ref 30.0–36.0)
MCV: 82.5 fL (ref 80.0–100.0)
Platelets: 337 10*3/uL (ref 150–400)
RBC: 4.12 MIL/uL (ref 3.87–5.11)
RDW: 14.6 % (ref 11.5–15.5)
WBC: 12.7 10*3/uL — ABNORMAL HIGH (ref 4.0–10.5)
nRBC: 0 % (ref 0.0–0.2)

## 2020-02-28 LAB — POCT FERN TEST
POCT Fern Test: NEGATIVE
POCT Fern Test: POSITIVE

## 2020-02-28 LAB — TYPE AND SCREEN
ABO/RH(D): O POS
Antibody Screen: NEGATIVE

## 2020-02-28 LAB — PROTEIN / CREATININE RATIO, URINE
Creatinine, Urine: 95.49 mg/dL
Protein Creatinine Ratio: 1.69 mg/mg{Cre} — ABNORMAL HIGH (ref 0.00–0.15)
Total Protein, Urine: 161 mg/dL

## 2020-02-28 MED ORDER — MISOPROSTOL 50MCG HALF TABLET: 50.0000 ug | ORAL_TABLET | ORAL | Status: DC

## 2020-02-28 MED ORDER — SODIUM CHLORIDE (PF) 0.9 % IJ SOLN
INTRAMUSCULAR | Status: DC | PRN
  Administered 2020-02-28: 12 mL/h via EPIDURAL

## 2020-02-28 MED ORDER — EPHEDRINE 5 MG/ML INJ: 10.0000 mg | INTRAVENOUS | Status: DC | PRN

## 2020-02-28 MED ORDER — SOD CITRATE-CITRIC ACID 500-334 MG/5ML PO SOLN: 30.0000 mL | ORAL | Status: DC | PRN

## 2020-02-28 MED ORDER — ONDANSETRON HCL 4 MG/2ML IJ SOLN: 4.0000 mg | INTRAMUSCULAR | Status: DC | PRN

## 2020-02-28 MED ORDER — PHENYLEPHRINE 40 MCG/ML (10ML) SYRINGE FOR IV PUSH (FOR BLOOD PRESSURE SUPPORT): 80.0000 ug | PREFILLED_SYRINGE | INTRAVENOUS | Status: DC | PRN

## 2020-02-28 MED ORDER — ONDANSETRON HCL 4 MG/2ML IJ SOLN: 4.0000 mg | Freq: Four times a day (QID) | INTRAMUSCULAR | Status: DC | PRN

## 2020-02-28 MED ORDER — SODIUM CHLORIDE 0.9 % IV SOLN
5.0000 10*6.[IU] | Freq: Once | INTRAVENOUS | Status: AC
  Administered 2020-02-28: 5 10*6.[IU] via INTRAVENOUS
  Filled 2020-02-28: qty 5

## 2020-02-28 MED ORDER — WITCH HAZEL-GLYCERIN EX PADS: 1.0000 "application " | MEDICATED_PAD | CUTANEOUS | Status: DC | PRN

## 2020-02-28 MED ORDER — LIDOCAINE HCL (PF) 1 % IJ SOLN: 30.0000 mL | INTRAMUSCULAR | Status: DC | PRN

## 2020-02-28 MED ORDER — PENICILLIN G POT IN DEXTROSE 60000 UNIT/ML IV SOLN: 3.0000 10*6.[IU] | INTRAVENOUS | Status: DC

## 2020-02-28 MED ORDER — LACTATED RINGERS IV SOLN: 500.0000 mL | Freq: Once | INTRAVENOUS | Status: DC

## 2020-02-28 MED ORDER — TETANUS-DIPHTH-ACELL PERTUSSIS 5-2.5-18.5 LF-MCG/0.5 IM SUSY: 0.5000 mL | PREFILLED_SYRINGE | Freq: Once | INTRAMUSCULAR | Status: DC

## 2020-02-28 MED ORDER — COCONUT OIL OIL: 1.0000 "application " | TOPICAL_OIL | Status: DC | PRN

## 2020-02-28 MED ORDER — DIBUCAINE (PERIANAL) 1 % EX OINT: 1.0000 "application " | TOPICAL_OINTMENT | CUTANEOUS | Status: DC | PRN

## 2020-02-28 MED ORDER — IBUPROFEN 600 MG PO TABS
600.0000 mg | ORAL_TABLET | Freq: Four times a day (QID) | ORAL | Status: DC
  Administered 2020-02-28 – 2020-03-01 (×7): 600 mg via ORAL
  Filled 2020-02-28 (×7): qty 1

## 2020-02-28 MED ORDER — TERBUTALINE SULFATE 1 MG/ML IJ SOLN: 0.2500 mg | Freq: Once | INTRAMUSCULAR | Status: DC | PRN

## 2020-02-28 MED ORDER — PRENATAL MULTIVITAMIN CH
1.0000 | ORAL_TABLET | Freq: Every day | ORAL | Status: DC
  Administered 2020-02-29 – 2020-03-01 (×2): 1 via ORAL
  Filled 2020-02-28 (×2): qty 1

## 2020-02-28 MED ORDER — LACTATED RINGERS IV SOLN: 500.0000 mL | INTRAVENOUS | Status: DC | PRN

## 2020-02-28 MED ORDER — DIPHENHYDRAMINE HCL 50 MG/ML IJ SOLN: 12.5000 mg | INTRAMUSCULAR | Status: DC | PRN

## 2020-02-28 MED ORDER — LACTATED RINGERS IV SOLN: INTRAVENOUS | Status: DC

## 2020-02-28 MED ORDER — SIMETHICONE 80 MG PO CHEW: 80.0000 mg | CHEWABLE_TABLET | ORAL | Status: DC | PRN

## 2020-02-28 MED ORDER — OXYTOCIN BOLUS FROM INFUSION
333.0000 mL | Freq: Once | INTRAVENOUS | Status: AC
  Administered 2020-02-28: 333 mL via INTRAVENOUS

## 2020-02-28 MED ORDER — FENTANYL-BUPIVACAINE-NACL 0.5-0.125-0.9 MG/250ML-% EP SOLN: 12.0000 mL/h | EPIDURAL | Status: DC | PRN

## 2020-02-28 MED ORDER — OXYCODONE-ACETAMINOPHEN 5-325 MG PO TABS: 1.0000 | ORAL_TABLET | ORAL | Status: DC | PRN

## 2020-02-28 MED ORDER — OXYCODONE-ACETAMINOPHEN 5-325 MG PO TABS: 2.0000 | ORAL_TABLET | ORAL | Status: DC | PRN

## 2020-02-28 MED ORDER — ACETAMINOPHEN 325 MG PO TABS: 650.0000 mg | ORAL_TABLET | ORAL | Status: DC | PRN

## 2020-02-28 MED ORDER — OXYTOCIN-SODIUM CHLORIDE 30-0.9 UT/500ML-% IV SOLN
2.5000 [IU]/h | INTRAVENOUS | Status: DC
  Administered 2020-02-28: 2.5 [IU]/h via INTRAVENOUS
  Filled 2020-02-28: qty 500

## 2020-02-28 MED ORDER — BENZOCAINE-MENTHOL 20-0.5 % EX AERO: 1.0000 "application " | INHALATION_SPRAY | CUTANEOUS | Status: DC | PRN

## 2020-02-28 MED ORDER — FENTANYL-BUPIVACAINE-NACL 0.5-0.125-0.9 MG/250ML-% EP SOLN
12.0000 mL/h | EPIDURAL | Status: DC | PRN
  Filled 2020-02-28: qty 250

## 2020-02-28 MED ORDER — ONDANSETRON HCL 4 MG PO TABS: 4.0000 mg | ORAL_TABLET | ORAL | Status: DC | PRN

## 2020-02-28 MED ORDER — SENNOSIDES-DOCUSATE SODIUM 8.6-50 MG PO TABS
2.0000 | ORAL_TABLET | ORAL | Status: DC
  Administered 2020-02-28 – 2020-02-29 (×2): 2 via ORAL
  Filled 2020-02-28 (×2): qty 2

## 2020-02-28 MED ORDER — LIDOCAINE HCL (PF) 1 % IJ SOLN
INTRAMUSCULAR | Status: DC | PRN
  Administered 2020-02-28: 5 mL via EPIDURAL

## 2020-02-28 MED ORDER — LACTATED RINGERS IV SOLN
500.0000 mL | Freq: Once | INTRAVENOUS | Status: AC
  Administered 2020-02-28: 500 mL via INTRAVENOUS

## 2020-02-28 MED ORDER — DIPHENHYDRAMINE HCL 25 MG PO CAPS: 25.0000 mg | ORAL_CAPSULE | Freq: Four times a day (QID) | ORAL | Status: DC | PRN

## 2020-02-28 NOTE — Discharge Summary (Signed)
Postpartum Discharge Summary     Patient Name: Molly Carr DOB: 1990-04-22 MRN: [id number]  Date of admission: 02/28/2020 Delivery date:02/28/2020  Delivering provider: Gerlene Fee  Date of discharge: 03/01/2020  Admitting diagnosis: Gestational diabetes mellitus (GDM) controlled on oral hypoglycemic drug [O24.415] Intrauterine pregnancy: [redacted]w[redacted]d    Secondary diagnosis:  Active Problems:   GBS (group B Streptococcus carrier), +RV culture, currently pregnant   Gestational diabetes mellitus (GDM) controlled on oral hypoglycemic drug   Gestational hypertension  Additional problems: none    Discharge diagnosis: Term Pregnancy Delivered, Gestational Hypertension and GDM A2                                              Post partum procedures:none Augmentation: none Complications: None  Hospital course: Onset of Labor With Vaginal Delivery      29y.o. yo G2P1001 at 356w5das admitted in Active Labor on 02/28/2020. Patient had a labor course remarkable for being rapid with some mild range BP elevations. Her pre-e labs collected after delivery were neg (CMP) and elevated for her P/C ratio (1.69), however there could have been some blood contamination.   Membrane Rupture Time/Date: 11:00 AM ,02/28/2020   Delivery Method:Vaginal, Spontaneous  Episiotomy: None  Lacerations:  1st degree;Periurethral  Patient had a postpartum course remarkable for close monitoring of her BP.  She is ambulating, tolerating a regular diet, passing flatus, and urinating well. Her fasting blood sugar on PPD#1 was 108. On PPD#2, prior to d/c, she was started on Norvasc 91m58mue to intermittent mild range BP elevations. Patient is discharged home in stable condition on 03/01/20.  Newborn Data: Birth date:02/28/2020  Birth time:7:43 PM  Gender:Female  Living status:Living  Apgars:8 ,9  Weight:2909 g (6lb 6.6oz)  Magnesium Sulfate received: No BMZ received:  No Rhophylac:N/A MMR:N/A T-DaP:Given prenatally Flu: No Transfusion:No  Physical exam  Vitals:   02/29/20 0600 02/29/20 1344 02/29/20 1951 03/01/20 0507  BP: 126/75 131/73 131/72 120/90  Pulse: 93 92 100 87  Resp: '18 18 19 18  ' Temp: 98.5 F (36.9 C) 98.6 F (37 C) 97.6 F (36.4 C) 98.9 F (37.2 C)  TempSrc: Oral Oral Oral Oral  SpO2:  98% 98% 98%   General: alert and cooperative Lochia: appropriate Uterine Fundus: firm Incision: N/A DVT Evaluation: No evidence of DVT seen on physical exam. Labs: Lab Results  Component Value Date   WBC 12.7 (H) 02/28/2020   HGB 10.9 (L) 02/28/2020   HCT 34.0 (L) 02/28/2020   MCV 82.5 02/28/2020   PLT 337 02/28/2020   CMP Latest Ref Rng & Units 02/28/2020  Glucose 70 - 99 mg/dL 124(H)  BUN 6 - 20 mg/dL 10  Creatinine 0.44 - 1.00 mg/dL 0.59  Sodium 135 - 145 mmol/L 136  Potassium 3.5 - 5.1 mmol/L 3.9  Chloride 98 - 111 mmol/L 107  CO2 22 - 32 mmol/L 19(L)  Calcium 8.9 - 10.3 mg/dL 9.2  Total Protein 6.5 - 8.1 g/dL 6.3(L)  Total Bilirubin 0.3 - 1.2 mg/dL 0.4  Alkaline Phos 38 - 126 U/L 91  AST 15 - 41 U/L 17  ALT 0 - 44 U/L 13   Edinburgh Score: No flowsheet data found.   After visit meds:  Allergies as of 03/01/2020   No Known Allergies     Medication List  STOP taking these medications   Accu-Chek Guide test strip Generic drug: glucose blood   Accu-Chek Guide w/Device Kit   Accu-Chek Softclix Lancets lancets   clobetasol ointment 0.05 % Commonly known as: [place] Supp Med Misc   metFORMIN 500 MG tablet Commonly known as: Glucophage   omeprazole 10 MG capsule Commonly known as: PRILOSEC     TAKE these medications   amLODipine 5 MG tablet Commonly known as: NORVASC Take 1 tablet (5 mg total) by mouth daily.   ibuprofen 600 MG tablet Commonly known as: ADVIL Take 1 tablet (600 mg total) by mouth every 6 (six) hours as needed.   multivitamin-prenatal 27-0.8 MG Tabs  tablet Take 1 tablet by mouth daily at 12 noon.        Discharge home in stable condition Infant Feeding: Bottle and Breast Infant Disposition:home with mother Discharge instruction: per After Visit Summary and Postpartum booklet. Activity: Advance as tolerated. Pelvic rest for 6 weeks.  Diet: routine diet Future Appointments: Future Appointments  Date Time Provider [place]  03/07/2020 10:00 AM [facility] None  04/11/2020  8:30 AM [facility] None  04/11/2020  8:55 AM Leftwich-Kirby, Kathie Dike, CNM [facility] None   Follow up Visit:  [facility] Follow up.   Why: You will be contacted with a blood pressure check in 1 week Contact information: [address] [place] [address] [phone]             Roma Schanz, CNM  P [facility] Please schedule this patient for PP visit in: 1 week  High risk pregnancy complicated by: M0HW, GHTN on admission  Delivery mode: SVD  Anticipated Birth Control: other/unsure  PP Procedures needed: bp check & 2hr GTT  Schedule Integrated [place] visit: no  Provider: Any provider   03/01/2020 Myrtis Ser, CNM  11:08 AM

## 2020-02-28 NOTE — Anesthesia Preprocedure Evaluation (Signed)
Anesthesia Evaluation  Patient identified by MRN, date of birth, ID band Patient awake    Reviewed: Allergy & Precautions, Patient's Chart, lab work & pertinent test results  Airway Mallampati: II       Dental   Pulmonary neg pulmonary ROS,    Pulmonary exam normal        Cardiovascular Normal cardiovascular exam     Neuro/Psych negative neurological ROS  negative psych ROS   GI/Hepatic GERD  ,  Endo/Other  diabetes  Renal/GU      Musculoskeletal   Abdominal   Peds  Hematology   Anesthesia Other Findings   Reproductive/Obstetrics (+) Pregnancy                             Anesthesia Physical Anesthesia Plan  ASA: II  Anesthesia Plan: Epidural   Post-op Pain Management:    Induction:   PONV Risk Score and Plan:   Airway Management Planned: Natural Airway  Additional Equipment: None  Intra-op Plan:   Post-operative Plan:   Informed Consent: I have reviewed the patients History and Physical, chart, labs and discussed the procedure including the risks, benefits and alternatives for the proposed anesthesia with the patient or authorized representative who has indicated his/her understanding and acceptance.     Interpreter used for [employer] Discussed with:   Anesthesia Plan Comments: (Lab Results      Component                Value               Date                      WBC                      12.7 (H)            02/28/2020                HGB                      10.9 (L)            02/28/2020                HCT                      34.0 (L)            02/28/2020                MCV                      82.5                02/28/2020                PLT                      337                 02/28/2020           )        Anesthesia Quick Evaluation

## 2020-02-28 NOTE — Anesthesia Procedure Notes (Signed)
Epidural Patient location during procedure: OB Start time: 02/28/2020 6:24 PM End time: 02/28/2020 6:31 PM  Staffing Anesthesiologist: Shelton Silvas, MD Performed: anesthesiologist   Preanesthetic Checklist Completed: patient identified, IV checked, site marked, risks and benefits discussed, surgical consent, monitors and equipment checked, pre-op evaluation and timeout performed  Epidural Patient position: sitting Prep: DuraPrep Patient monitoring: heart rate, continuous pulse ox and blood pressure Approach: midline Location: L3-L4 Injection technique: LOR saline  Needle:  Needle type: Tuohy  Needle gauge: 17 G Needle length: 9 cm Catheter type: closed end flexible Catheter size: 20 Guage Test dose: negative and 1.5% lidocaine  Assessment Events: blood not aspirated, injection not painful, no injection resistance and no paresthesia  Additional Notes LOR @ 5  Patient identified. Risks/Benefits/Options discussed with patient including but not limited to bleeding, infection, nerve damage, paralysis, failed block, incomplete pain control, headache, blood pressure changes, nausea, vomiting, reactions to medications, itching and postpartum back pain. Confirmed with bedside nurse the patient's most recent platelet count. Confirmed with patient that they are not currently taking any anticoagulation, have any bleeding history or any family history of bleeding disorders. Patient expressed understanding and wished to proceed. All questions were answered. Sterile technique was used throughout the entire procedure. Please see nursing notes for vital signs. Test dose was given through epidural catheter and negative prior to continuing to dose epidural or start infusion. Warning signs of high block given to the patient including shortness of breath, tingling/numbness in hands, complete motor block, or any concerning symptoms with instructions to call for help. Patient was given instructions on  fall risk and not to get out of bed. All questions and concerns addressed with instructions to call with any issues or inadequate analgesia.    Reason for block:procedure for pain

## 2020-02-28 NOTE — H&P (Addendum)
OBSTETRIC ADMISSION HISTORY AND PHYSICAL  Molly Carr is a 29 y.o. female G2P1001 with IUP at 69w5dby [place] SROM/SOL. She reports +FMs, No LOF, no VB, no blurry vision, headaches or peripheral edema, and RUQ pain.  She plans on breast and bottle feeding. She is unsure of what she wants to use for birth control. [facility] [place] [place]--->  Estimated Date of Delivery: 03/08/20  Sono:    '@[redacted]w[redacted]d' , CWD, normal anatomy, cephalic presentation, 39767H 53% EFW   Prenatal History/Complications:  -AA1PFX(Metformin 500 mg daily) -GBS pos  Past Medical History: Past Medical History:  Diagnosis Date  . Acid reflux   . Gastritis   . Gestational diabetes    Past Surgical History: Past Surgical History:  Procedure Laterality Date  . NO PAST SURGERIES     Obstetrical History: OB History    Gravida  2   Para  1   Term  1   Preterm      AB      Living  1     SAB      TAB      Ectopic      Multiple      Live Births             Social History Social History   Socioeconomic History  . Marital status: Married    Spouse name: Not on file  . Number of children: Not on file  . Years of education: Not on file  . Highest education level: Not on file  Occupational History  . Not on file  Tobacco Use  . Smoking status: Never Smoker  . Smokeless tobacco: Never Used  Substance and Sexual Activity  . Alcohol use: Never  . Drug use: Never  . Sexual activity: Yes  Other Topics Concern  . Not on file  Social History Narrative  . Not on file   Social Determinants of Health   Financial Resource Strain:   . Difficulty of Paying Living Expenses: Not on file  Food Insecurity:   . Worried About RCharity fundraiserin the Last Year: Not on file  . Ran Out of Food in the Last Year: Not on file  Transportation Needs:   . Lack of Transportation (Medical): Not on file  . Lack of Transportation  (Non-Medical): Not on file  Physical Activity:   . Days of Exercise per Week: Not on file  . Minutes of Exercise per Session: Not on file  Stress:   . Feeling of Stress : Not on file  Social Connections:   . Frequency of Communication with Friends and Family: Not on file  . Frequency of Social Gatherings with Friends and Family: Not on file  . Attends Religious Services: Not on file  . Active Member of Clubs or Organizations: Not on file  . Attends CArchivistMeetings: Not on file  . Marital Status: Not on file   Family History: Family History  Problem Relation Age of Onset  . Diabetes Mother   . Hypertension Mother   . Heart disease Father   . Heart disease Maternal Grandmother    Allergies: No Known Allergies  Medications Prior to Admission  Medication Sig Dispense Refill Last Dose  . Accu-Chek Softclix Lancets lancets Use as instructed 100 each 12 02/28/2020 at Unknown time  . Blood Glucose Monitoring Suppl (ACCU-CHEK GUIDE) w/Device KIT 1 each by  Does not apply route 4 (four) times daily. 1 kit 0 02/28/2020 at Unknown time  . glucose blood (ACCU-CHEK GUIDE) test strip Use to check blood sugars four times a day was instructed 50 each 12 02/28/2020 at Unknown time  . metFORMIN (GLUCOPHAGE) 500 MG tablet Take 1 tablet (500 mg total) by mouth daily with supper. 30 tablet 5 02/28/2020 at Unknown time  . omeprazole (PRILOSEC) 10 MG capsule Take 10 mg by mouth daily.    Past Week at Unknown time  . Prenatal Vit-Fe Fumarate-FA (MULTIVITAMIN-PRENATAL) 27-0.8 MG TABS tablet Take 1 tablet by mouth daily at 12 noon.   02/28/2020 at Unknown time  . clobetasol ointment (TEMOVATE) 0.10 % Apply 1 application topically 2 (two) times daily. (Patient not taking: Reported on 02/02/2020) 30 g 0   . Elastic Bandages & Supports (COMFORT FIT MATERNITY SUPP MED) MISC 1 Device by Does not apply route daily. (Patient not taking: Reported on 12/08/2019) 1 each 0    Review of Systems   All  systems reviewed and negative except as stated in HPI  Blood pressure 127/82, pulse 94, temperature 98.4 F (36.9 C), temperature source Oral, resp. rate 16, last menstrual period 11/18/2018, SpO2 100 %. General appearance: alert, cooperative, appears stated age and no distress Lungs: normal effort Heart: regular rate and rhythm Abdomen: soft, non-tender; bowel sounds normal Pelvic: complete Extremities: Homans sign is negative, no sign of DVT Presentation: cephalic Fetal monitoringBaseline: 170 bpm, Variability: Good {> 6 bpm), Accelerations: Reactive and Decelerations: variable to early Uterine activityFrequency: Every 1-4 minutes Dilation: 10 Effacement (%): 100 Station: 0 Exam by:: Autry-Lott  Prenatal labs: ABO, Rh: O/Positive/-- (05/17 0947) Antibody: Negative (05/17 0947) Rubella: 16.50 (05/17 0947) RPR: Non Reactive (09/07 1059)  HBsAg: Negative (05/17 0947)  HIV: Non Reactive (09/07 1059)  GBS: Positive/-- (11/09 0238)  1 hr Glucola 182 Genetic screening Low risk Anatomy US wnl, posterior placenta, female  Prenatal Transfer Tool  Maternal Diabetes: Yes:  Diabetes Type:  Insulin/Medication controlled Genetic Screening: Normal Maternal Ultrasounds/Referrals: Normal Fetal Ultrasounds or other Referrals:  None Maternal Substance Abuse:  No Significant Maternal Medications:  None Significant Maternal Lab Results: Group B Strep positive  Results for orders placed or performed during the hospital encounter of 02/28/20 (from the past 24 hour(s))  POCT fern test   Collection Time: 02/28/20  5:19 PM  Result Value Ref Range   POCT Fern Test Negative = intact amniotic membranes   CBC   Collection Time: 02/28/20  5:24 PM  Result Value Ref Range   WBC 12.7 (H) 4.0 - 10.5 K/uL   RBC 4.12 3.87 - 5.11 MIL/uL   Hemoglobin 10.9 (L) 12.0 - 15.0 g/dL   HCT 34.0 (L) 36 - 46 %   MCV 82.5 80.0 - 100.0 fL   MCH 26.5 26.0 - 34.0 pg   MCHC 32.1 30.0 - 36.0 g/dL   RDW 14.6 11.5 -  15.5 %   Platelets 337 150 - 400 K/uL   nRBC 0.0 0.0 - 0.2 %  Fern Test   Collection Time: 02/28/20  5:24 PM  Result Value Ref Range   POCT Fern Test Positive = ruptured amniotic membanes     Patient Active Problem List   Diagnosis Date Noted  . Gestational diabetes mellitus (GDM) controlled on oral hypoglycemic drug 02/28/2020  . GBS (group B Streptococcus carrier), +RV culture, currently pregnant 02/12/2020  . Gestational diabetes mellitus (GDM), antepartum 12/24/2019  . Supervision of other normal pregnancy, antepartum 07/31/2019  Assessment/Plan:  Navie Lamoreaux is a 29 y.o. G2P1001 at 57w5dhere for SROM/SOL.  A2GDM -q4h CBGs in latent labor -q2h CBGs in active labor  Elevated BP  On admission  145/82 on admission most recent 143/84. Otherwise normotensive throughout pregnancy. -Obtain pre-E labs   #Labor: Expectant management. Consider starting pitocin if cervix unchanged at time of next cervical exam.  #Pain: Epidural #FWB: Cat II, position change and imminent delivery #ID: GBS +, PCN ppx #MOF: Both #MOC: Unsure #Circ: N/a  Simone Autry-Lott, DO  02/28/2020, 7:33 PM   I spoke with and examined patient and agree with resident/PA-S/MS/SNM's note and plan of care.  KRoma Schanz CNM, [facility]11/28/2021 8:15 PM

## 2020-02-28 NOTE — MAU Provider Note (Signed)
None      S: Ms. Jahmya Onofrio Salomon Fick is a 29 y.o. G2P1001 at [redacted]w[redacted]d  who presents to MAU today complaining of leaking of fluid since this morning. She denies vaginal bleeding. She endorses contractions since yesterday. She reports normal fetal movement.    O: BP 127/82   Pulse 94   Temp 98.4 F (36.9 C) (Oral)   Resp 16   LMP 11/18/2018 (Exact Date)   SpO2 100% Comment: room air GENERAL: Well-developed, well-nourished female in no acute distress.  HEAD: Normocephalic, atraumatic.  CHEST: Normal effort of breathing, regular heart rate ABDOMEN: Soft, nontender, gravid PELVIC: Normal external female genitalia. Vagina is pink and rugated. Cervix appears open, no apparent lesions. Normal discharge.  Negative pooling. Fern Collected  Cervical exam:  Dilation: 3 Effacement (%): 90 Station: -2 Exam by:: J.Tegan Britain CNM    Fetal Monitoring: FHT: 145 bpm, Mod Var, -Decels, +Accels Toco: Ctx palpated and moderate  Results for orders placed or performed during the hospital encounter of 02/28/20 (from the past 24 hour(s))  POCT fern test     Status: Normal   Collection Time: 02/28/20  5:19 PM  Result Value Ref Range   POCT Fern Test Negative = intact amniotic membranes      A: SIUP at [redacted]w[redacted]d  SROM  Language Barrier  P: In person interpreter present for taking of Hx and during exam. Crist Fat returns positive Nurse instructed to inform L&D Team and release Indx Orders.  Gerrit Heck, CNM 02/28/2020 5:23 PM

## 2020-02-28 NOTE — MAU Note (Signed)
Molly Carr is a 29 y.o. at [redacted]w[redacted]d here in MAU reporting: contractions since last night and now they are every 5-10 minutes. Having some bleeding, states it is mucus with blood. Also ? LOF, states it was clear, states had some leaking 1 time. DFM.     Onset of complaint: last night  Pain score: 8/10  Vitals:   02/28/20 1625  BP: (!) 156/81  Pulse: 97  Resp: 16  Temp: 98.4 F (36.9 C)  SpO2: 100%     FHT: 150  Lab orders placed from triage: none

## 2020-02-29 ENCOUNTER — Encounter: Payer: Self-pay | Admitting: Obstetrics & Gynecology

## 2020-02-29 ENCOUNTER — Other Ambulatory Visit (HOSPITAL_COMMUNITY): Payer: Self-pay

## 2020-02-29 DIAGNOSIS — O165 Unspecified maternal hypertension, complicating the puerperium: Secondary | ICD-10-CM

## 2020-02-29 LAB — RPR: RPR Ser Ql: NONREACTIVE

## 2020-02-29 LAB — GLUCOSE, CAPILLARY: Glucose-Capillary: 108 mg/dL — ABNORMAL HIGH (ref 70–99)

## 2020-02-29 NOTE — Anesthesia Postprocedure Evaluation (Signed)
Anesthesia Post Note  Patient: Sandrika Schwinn  Procedure(s) Performed: AN AD HOC LABOR EPIDURAL     Patient location during evaluation: Mother Baby Anesthesia Type: Epidural Level of consciousness: awake, oriented and awake and alert Pain management: pain level controlled Vital Signs Assessment: post-procedure vital signs reviewed and stable Respiratory status: spontaneous breathing, nonlabored ventilation and respiratory function stable Cardiovascular status: stable Postop Assessment: no headache, patient able to bend at knees, no apparent nausea or vomiting, no backache, adequate PO intake and able to ambulate Anesthetic complications: no   No complications documented.  Last Vitals:  Vitals:   02/29/20 0210 02/29/20 0600  BP: 134/70 126/75  Pulse: 91 93  Resp: 18 18  Temp: 37.1 C 36.9 C  SpO2:      Last Pain:  Vitals:   02/29/20 0720  TempSrc:   PainSc: 0-No pain   Pain Goal:                   Clementina Mareno

## 2020-02-29 NOTE — Progress Notes (Signed)
POSTPARTUM PROGRESS NOTE  Post Partum Day 1  Subjective:  Molly Carr is a 29 y.o. [id number] s/p SVD at [redacted]w[redacted]d.  No acute events overnight.  Pt denies problems with ambulating, voiding or po intake.  She denies nausea or vomiting.  Pain is well controlled.  She has had flatus. She has not had bowel movement.  Lochia Small.   Objective: Blood pressure 126/75, pulse 93, temperature 98.5 F (36.9 C), temperature source Oral, resp. rate 18, last menstrual period 11/18/2018, SpO2 97 %, unknown if currently breastfeeding.  Physical Exam:  General: alert, cooperative and no distress Chest: no respiratory distress Heart:regular rate, distal pulses intact Abdomen: soft, nontender,  Uterine Fundus: firm, appropriately tender DVT Evaluation: No calf swelling or tenderness Extremities: no edema Skin: warm, dry  Recent Labs    02/28/20 1724  HGB 10.9*  HCT 34.0*    Assessment/Plan: Molly Carr is a 29 y.o. [phone] s/p SVD at [redacted]w[redacted]d   PPD#1 - Doing well Contraception: unsure - educated and discussed with patient birth control options  Feeding: breast  Dispo: Plan for discharge tomorrow. Hypertension: initially diagnosed with GHTN intrapartum, PCR 1.69, discussed with patient dx PEC without SF. BPs today 134/70 and 126/75, continue to monitor closely, BP check in 1 week.    LOS: 1 day   Sharyon Cable, CNM 02/29/2020, 8:27 AM

## 2020-03-01 ENCOUNTER — Other Ambulatory Visit (HOSPITAL_COMMUNITY): Payer: Self-pay | Admitting: Advanced Practice Midwife

## 2020-03-01 DIAGNOSIS — O24435 Gestational diabetes mellitus in puerperium, controlled by oral hypoglycemic drugs: Secondary | ICD-10-CM

## 2020-03-01 DIAGNOSIS — O139 Gestational [pregnancy-induced] hypertension without significant proteinuria, unspecified trimester: Secondary | ICD-10-CM | POA: Diagnosis not present

## 2020-03-01 LAB — SURGICAL PATHOLOGY

## 2020-03-01 MED ORDER — IBUPROFEN 600 MG PO TABS: 600.0000 mg | ORAL_TABLET | Freq: Four times a day (QID) | ORAL | 0 refills | Status: DC | PRN

## 2020-03-01 MED ORDER — AMLODIPINE BESYLATE 5 MG PO TABS
5.0000 mg | ORAL_TABLET | Freq: Every day | ORAL | Status: DC
  Administered 2020-03-01: 5 mg via ORAL
  Filled 2020-03-01: qty 1

## 2020-03-01 MED ORDER — INFLUENZA VAC SPLIT QUAD 0.5 ML IM SUSY: 0.5000 mL | PREFILLED_SYRINGE | INTRAMUSCULAR | Status: DC

## 2020-03-01 MED ORDER — INFLUENZA VAC SPLIT QUAD 0.5 ML IM SUSY
0.5000 mL | PREFILLED_SYRINGE | Freq: Once | INTRAMUSCULAR | Status: AC
  Administered 2020-03-01: 0.5 mL via INTRAMUSCULAR

## 2020-03-01 MED ORDER — AMLODIPINE BESYLATE 5 MG PO TABS: 5.0000 mg | ORAL_TABLET | Freq: Every day | ORAL | 2 refills | Status: DC

## 2020-03-01 MED FILL — AMLODIPINE BESYLATE 5 MG TA: 5 | 30 days supply | Qty: 30 | Fill #0

## 2020-03-01 MED FILL — IBUPROFEN 600 MG TABLET: 600 | 7 days supply | Qty: 30 | Fill #0

## 2020-03-01 NOTE — Discharge Instructions (Signed)
Hipertensin posparto Postpartum Hypertension La hipertensin posparto es el aumento de la presin arterial que se mantiene ms alta de lo normal despus del parto. Puede no darse cuenta de que tiene hipertensin posparto si no le miden la presin arterial con regularidad. En la [employer], la hipertensin posparto desaparece sola, por lo general en la semana posterior al parto. Sin embargo, algunas mujeres requieren tratamiento mdico para prevenir complicaciones graves, como convulsiones o un accidente cerebrovascular. Cules son las causas? Esta afeccin puede ser causada por uno o ms de lo siguiente:  Hipertensin que exista antes del embarazo (hipertensin crnica).  Hipertensin que surge como resultado del embarazo (hipertensin gestacional).  Trastornos hipertensivos Academic librarian (preeclampsia) o convulsiones en las mujeres que tienen hipertensin arterial durante el embarazo (eclampsia).  Una afeccin en la que el hgado, las plaquetas y los glbulos rojos se daan durante el embarazo (sndrome de HELLP).  Una afeccin en la cual la glndula tiroidea produce demasiadas hormonas (hipertiroidismo).  Otros problemas poco frecuentes de los nervios (trastornos neurolgicos) o trastornos de Risk manager. En algunos casos, es posible que la causa se desconozca. Qu incrementa el riesgo? Los siguientes factores pueden hacer que usted sea ms propensa a tener esta afeccin:  Hipertensin crnica. En algunos casos, es posible que esta no se haya diagnosticado antes del embarazo.  Obesidad.  Diabetes tipo 2.  Enfermedad renal.  Antecedentes de preeclampsia o eclampsia.  Otras afecciones mdicas que modifican el nivel de hormonas en el cuerpo (desequilibrio hormonal). Cules son los signos o los sntomas? Al igual que con todos los tipos de hipertensin, la hipertensin posparto puede no causar ningn sntoma. Segn lo alta que est la presin arterial, puede  presentar lo siguiente:  Dolores de [place]. Estos pueden ser leves, moderados o intensos. Tambin pueden ser regulares, constantes o de inicio repentino (cefalea en estallido).  Cambios en la capacidad para ver (cambios visuales).  Mareos.  Falta de aire.  Hinchazn de [employer], los pies, la parte inferior de las piernas o el rostro. En algunos casos, puede tener hinchazn en varias de estas reas.  Palpitaciones o latidos cardacos acelerados.  Dificultad para respirar al Tressie Ellis.  Disminucin en la cantidad de orina que elimina. Otros signos y sntomas poco frecuentes pueden incluir lo siguiente:  Ms sudoracin que la habitual. Esta dura algo ms que [employer] del parto.  Dolor en el pecho.  Mareos repentinos al levantarse despus de haber estado sentada o [place].  Convulsiones.  Nuseas o vmitos.  Dolor abdominal. Cmo se diagnostica? Esta afeccin se puede diagnosticar en funcin de los resultados de un examen fsico, mediciones de la presin arteria, y [place] y [place]. Tambin puede someterse a otras pruebas, como una exploracin por tomografa computarizada (TC) o una resonancia magntica (RM) para Engineer, manufacturing otros problemas de la hipertensin posparto. Cmo se trata? Si la presin arterial est lo suficientemente alta como para requerir tratamiento, las opciones pueden incluir lo siguiente:  Medicamentos para disminuir la presin arterial (antihipertensivos). Dgale al mdico si est amamantando o si planifica hacerlo. Hay muchos medicamentos antihipertensivos que pueden tomarse sin riesgos durante la Market researcher.  Interrupcin de los [employer] puedan causar la hipertensin.  Tratamiento de las enfermedades que causan la hipertensin.  Tratamiento de las complicaciones de la hipertensin, como convulsiones, accidente cerebrovascular o problemas renales. El mdico seguir controlando atentamente la presin arterial hasta que esta se  encuentre en un nivel seguro para usted. Siga estas indicaciones en su  casa:  Tome los medicamentos de venta libre y los recetados solamente como se lo haya indicado el mdico.  Retome sus actividades normales como se lo haya indicado el mdico. Pregntele al mdico qu actividades son seguras para usted.  No consuma ningn producto que contenga nicotina o tabaco, como cigarrillos y cigarrillos electrnicos. Si necesita ayuda para dejar de fumar, consulte al mdico.  Concurra a todas las visitas de control como se lo haya indicado el mdico. Esto es importante. Comunquese con un mdico si:  Los sntomas empeoran.  Aparecen nuevos sntomas, por ejemplo: ? Un dolor de cabeza que no mejora. ? Mareos. ? Cambios en la visin. Solicite ayuda de inmediato si:  Presenta hinchazn repentina en las manos, los tobillos o el rostro.  Tiene un aumento de peso repentino y rpido.  Tiene dificultad para respirar, dolor en el pecho, latidos cardacos acelerados o palpitaciones cardacas.  Siente dolor intenso en el abdomen.  Tiene sntomas de un accidente cerebrovascular. "BE FAST" es una manera fcil de recordar las principales seales de advertencia de un accidente cerebrovascular: ? B - Balance (equilibrio). Los signos son dificultad repentina para caminar o prdida del equilibrio. ? E - Eyes (ojos). Los signos son dificultad para ver o un cambio repentino en la visin. ? F - Face (rostro). Los signos son debilidad repentina o entumecimiento del rostro, o el rostro o el prpado que se caen hacia un lado. ? A - Arms (brazos). Los signos son debilidad o entumecimiento en un brazo. Esto sucede de repente y generalmente en un lado del cuerpo. ? S - Speech (habla). Los signos son dificultad para hablar, hablar arrastrando las palabras o dificultad para comprender lo que la gente dice. ? T - Time (tiempo). Es tiempo de llamar a los servicios de emergencias. Escriba la hora en la que comenzaron los  sntomas.  Presenta otros signos de accidente cerebrovascular, como los siguientes: ? Dolor de cabeza sbito e intenso que no tiene causa aparente. ? Nuseas o vmitos. ? Convulsiones. Estos sntomas pueden representar un problema grave que constituye una emergencia. No espere hasta que los sntomas desaparezcan. Solicite atencin mdica de inmediato. Comunquese con el servicio de emergencias de su localidad (911 en los [place]). No conduzca por sus propios medios hasta el hospital. Resumen  La hipertensin posparto es el aumento de la presin arterial que se mantiene ms alta de lo normal despus del parto.  En la mayora de los casos, la hipertensin posparto desaparece sola, por lo general en la semana posterior al parto.  Algunas mujeres requieren tratamiento mdico para prevenir complicaciones graves, como convulsiones o un accidente cerebrovascular. Esta informacin no tiene como fin reemplazar el consejo del mdico. Asegrese de hacerle al mdico cualquier pregunta que tenga. Document Revised: 06/29/2017 Document Reviewed: 03/10/2017 Elsevier Patient Education  2020 Elsevier Inc.   Parto vaginal, cuidados de puerperio Postpartum Care After Vaginal Delivery Lea esta informacin sobre cmo cuidarse desde el momento en que nazca su beb y hasta 6 a 12 semanas despus del parto (perodo del posparto). El mdico tambin podr darle instrucciones ms especficas. Comunquese con su mdico si tiene problemas o preguntas. Siga estas indicaciones en su casa: Hemorragia vaginal  Es normal tener un poco de hemorragia vaginal (loquios) despus del parto. Use un apsito sanitario para el sangrado vaginal y secrecin. ? Durante la primera semana despus del parto, la cantidad y el aspecto de los loquios a menudo es similar a las del perodo menstrual. ? Durante las siguientes   semanas disminuir gradualmente hasta convertirse en una secrecin seca amarronada o amarillenta. ? En la  mayora de las mujeres, los loquios se detienen completamente entre 4 a 6semanas despus del parto. Los sangrados vaginales pueden variar de mujer a mujer.  Cambie los apsitos sanitarios con frecuencia. Observe si hay cambios en el flujo, como: ? Un aumento repentino en el volumen. ? Cambio en el color. ? Cogulos sanguneos grandes.  Si expulsa un cogulo de sangre por la vagina, gurdelo y llame al mdico para informrselo. No deseche los cogulos de sangre por el inodoro antes de hablar con su mdico.  No use tampones ni se haga duchas vaginales hasta que el mdico la autorice.  Si no est amamantando, volver a tener su perodo entre 6 y 8 semanas despus del parto. Si solamente alimenta al beb con leche materna (lactancia materna exclusiva), podra no volver a tener su perodo hasta que deje de amamantar. Cuidados perineales  Mantenga la zona entre la vagina y el ano (perineo) limpia y seca, como se lo haya indicado el mdico. Utilice apsitos o aerosoles analgsicos y cremas, como se lo hayan indicado.  Si le hicieron un corte en el perineo (episiotoma) o tuvo un desgarro en la vagina, controle la zona para detectar signos de infeccin hasta que sane. Est atenta a los siguientes signos: ? Aumento del enrojecimiento, la hinchazn o el dolor. ? Presenta lquido o sangre que supura del corte o desgarro. ? Calor. ? Pus o mal olor.  Es posible que le den una botella rociadora para que use en lugar de limpiarse el rea con papel higinico despus de usar el bao. Cuando comience a sanar, podr usar la botella rociadora antes de secarse. Asegrese de secarse suavemente.  Para aliviar el dolor causado por una episiotoma, un desgarro en la vagina o venas hinchadas en el ano (hemorroides), trate de tomar un bao de asiento tibio 2 o 3 veces por da. Un bao de asiento es un bao de agua tibia que se toma mientras se est sentado. El agua solo debe llegar hasta las caderas y cubrir las  nalgas. Cuidado de las mamas  En los primeros das despus del parto, las mamas pueden sentirse pesadas, llenas e incmodas (congestin mamaria). Tambin puede escaparse leche de sus senos. El mdico puede sugerirle mtodos para aliviar este malestar. La congestin mamaria debera desaparecer al cabo de unos das.  Si est amamantando: ? Use un sostn que sujete y ajuste bien sus pechos. ? Mantenga los pezones secos y limpios. Aplquese cremas y ungentos, como se lo haya indicado el mdico. ? Es posible que deba usar discos de algodn en el sostn para absorber la leche que se filtre de sus senos. ? Puede tener contracciones uterinas cada vez que amamante durante varias semanas despus del parto. Las contracciones uterinas ayudan al tero a regresar a su tamao habitual. ? Si tiene algn problema con la lactancia materna, colabore con el mdico o un asesor en lactancia.  Si no est amamantando: ? Evite tocarse mucho las mamas. Al hacerlo, podran producir ms leche. ? Use un sostn que le proporcione el ajuste correcto y compresas fras para reducir la hinchazn. ? No extraiga (saque) leche materna. Esto har que produzca ms leche. Intimidad y sexualidad  Pregntele al mdico cundo puede retomar la actividad sexual. Esto puede depender de lo siguiente: ? Su riesgo de sufrir infecciones. ? La rapidez con la que est sanando. ? Su comodidad y deseo de retomar la actividad sexual.    del parto, puede quedar embarazada incluso si no ha tenido todava su perodo. Si lo desea, hable con el mdico acerca de los mtodos de control de la natalidad (mtodos anticonceptivos). Medicamentos  [employer] de venta libre y los recetados solamente como se lo haya indicado el mdico.  Si le recetaron un antibitico, tmelo como se lo haya indicado el mdico. No deje de tomar el antibitico aunque comience a sentirse mejor. Actividad  Retome sus actividades normales de a poco como se lo haya indicado  el mdico. Pregntele al mdico qu actividades son seguras para usted.  Descanse todo lo que pueda. Trate de descansar o tomar una siesta mientras el beb duerme. Comida y bebida   Beba suficiente lquido como para Pharmacologist la orina de color amarillo plido.  Coma alimentos ricos en [employer]. Estos pueden ayudarla a prevenir o Educational psychologist. Los alimentos ricos en fibras incluyen, entre otros: ? Panes y cereales integrales. ? Arroz integral. ? Armed forces operational officer. ? Nils Pyle y verduras frescas.  No intente perder de peso rpidamente reduciendo el consumo de caloras.  Tome sus vitaminas prenatales hasta la visita de seguimiento de posparto o hasta que su mdico le indique que puede dejar de tomarlas. Estilo de vida  No consuma ningn producto que contenga nicotina o tabaco, como cigarrillos y Administrator, Civil Service. Si necesita ayuda para dejar de fumar, consulte al mdico.  No beba alcohol, especialmente si est amamantando. Instrucciones generales  Concurra a todas las visitas de seguimiento para usted y el beb, como se lo haya indicado el mdico. La mayora de las mujeres visita al mdico para un seguimiento de posparto dentro de las primeras 3 a 6 semanas despus del parto. Comunquese con un mdico si:  Se siente incapaz de controlar los cambios que implica tener un hijo y esos sentimientos no desaparecen.  Siente tristeza o preocupacin de forma inusual.  Las mamas se ponen rojas, le duelen o se endurecen.  Tiene fiebre.  Tiene dificultad para retener la [place] o para impedir que la orina se escape.  Tiene poco inters o falta de inters en actividades que solan gustarle.  No ha amamantado nada y no ha tenido un perodo menstrual durante 12 semanas despus del [place].  Dej de amamantar al beb y no ha tenido su perodo menstrual durante 12 semanas despus de dejar de Museum/gallery exhibitions officer.  Tiene preguntas sobre su cuidado y el del beb.  Elimina un cogulo de  sangre grande por la vagina. Solicite ayuda de inmediato si:  Midwife.  Tiene dificultad para respirar.  Tiene un dolor repentino e intenso en la pierna.  Tiene dolor intenso o clicos en el la parte inferior del abdomen.  Tiene una hemorragia tan intensa de la vagina que empapa ms de un apsito en [employer]. El sangrado no debe ser ms abundante que el perodo ms intenso que haya tenido.  Dolor de cabeza intenso.  Se desmaya.  Tiene visin borrosa o Nurse, adult.  Tiene secrecin vaginal con mal olor.  Tiene pensamientos acerca de lastimarse a usted misma o a su beb. Si alguna vez siente que puede lastimarse a usted misma o a Economist, o tiene pensamientos de poner fin a su vida, busque ayuda de inmediato. Puede dirigirse al departamento de emergencias ms cercano o llamar a:  El servicio de emergencias de su localidad (911 en [place]  Una lnea de asistencia al suicida y Visual merchandiser en crisis, como la [employer]  Nacional de Prevencin del Suicidio (National Suicide Prevention Lifeline), al 1-800-273-8255. Est disponible las 24 horas del da. Resumen  El perodo de tiempo justo despus el parto y hasta 6 a 12 semanas despus del parto se denomina perodo posparto.  Retome sus actividades normales de a poco como se lo haya indicado el mdico.  Concurra a todas las visitas de seguimiento para usted y el beb, como se lo haya indicado el mdico. Esta informacin no tiene como fin reemplazar el consejo del mdico. Asegrese de hacerle al mdico cualquier pregunta que tenga. Document Revised: 06/29/2017 Document Reviewed: 03/10/2017 Elsevier Patient Education  2020 Elsevier Inc.  

## 2020-03-02 ENCOUNTER — Inpatient Hospital Stay (HOSPITAL_COMMUNITY): Admission: AD | Admit: 2020-03-02 | Payer: Self-pay | Source: Home / Self Care | Admitting: Obstetrics and Gynecology

## 2020-03-02 ENCOUNTER — Inpatient Hospital Stay (HOSPITAL_COMMUNITY): Payer: Self-pay

## 2020-03-07 ENCOUNTER — Other Ambulatory Visit: Payer: Self-pay

## 2020-03-07 ENCOUNTER — Ambulatory Visit (INDEPENDENT_AMBULATORY_CARE_PROVIDER_SITE_OTHER): Payer: Self-pay

## 2020-03-07 VITALS — BP 124/85 | HR 109 | Temp 99.1°F

## 2020-03-07 DIAGNOSIS — Z0130 Encounter for examination of blood pressure without abnormal findings: Secondary | ICD-10-CM

## 2020-03-07 NOTE — Progress Notes (Addendum)
SPANISH INTERPRETER: ROSE [phone]  Subjective:  Molly Carr is a 28 y.o. female here for BP check at 1 week PP.   Hypertension ROS: taking medications as instructed, no medication side effects noted, no TIA's, no chest pain on exertion, no dyspnea on exertion and no swelling of ankles.    Objective:  BP 124/85   Pulse (!) 109   Temp 99.1 F (37.3 C)   LMP 11/18/2018 (Exact Date)   Breastfeeding Yes   Appearance alert, well appearing, and in no distress. General exam BP noted to be well controlled today in office.    Assessment:   Blood Pressure well controlled.   Plan:  Current treatment plan is effective, no change in therapy. RTO in 1 week for BP check per Dr. Clearance Coots.  Patient was assessed and managed by nursing staff during this encounter. I have reviewed the chart and agree with the documentation and plan. I have also made any necessary editorial changes.  Coral Ceo, MD 03/07/2020 1:02 PM

## 2020-03-14 ENCOUNTER — Other Ambulatory Visit: Payer: Self-pay

## 2020-03-14 ENCOUNTER — Ambulatory Visit (INDEPENDENT_AMBULATORY_CARE_PROVIDER_SITE_OTHER): Payer: Self-pay

## 2020-03-14 VITALS — BP 123/80 | HR 99

## 2020-03-14 DIAGNOSIS — Z0130 Encounter for examination of blood pressure without abnormal findings: Secondary | ICD-10-CM

## 2020-03-14 NOTE — Progress Notes (Signed)
Subjective:  Molly Carr is a 29 y.o. female seen on 03/07/20 for BP check s/p NSVD on 02/28/20. She is here for 2nd BP check.  Hypertension ROS: taking medications as instructed, no medication side effects noted, no TIA's, no chest pain on exertion, no dyspnea on exertion and no swelling of ankles.    Objective:  BP 123/80   Pulse 99   LMP 11/18/2018 (Exact Date)  Breastfeeding Yes. Appearance alert, well appearing, and in no distress. General exam BP noted to be well controlled today in office.    Assessment:   Blood Pressure well controlled.   Plan:  Current treatment plan is effective, no change in therapy.  Keep upcoming fasting 2 gtt appt Keep upcoming routine pp visit

## 2020-03-17 NOTE — Progress Notes (Signed)
Patient was assessed and managed by nursing staff during this encounter. I have reviewed the chart and agree with the documentation and plan. I have also made any necessary editorial changes.  Trudy Kory, MD 03/17/2020 10:28 AM    

## 2020-04-11 ENCOUNTER — Other Ambulatory Visit: Payer: Self-pay

## 2020-04-11 ENCOUNTER — Encounter: Payer: Self-pay | Admitting: Advanced Practice Midwife

## 2020-04-11 ENCOUNTER — Ambulatory Visit (INDEPENDENT_AMBULATORY_CARE_PROVIDER_SITE_OTHER): Payer: Self-pay | Admitting: Advanced Practice Midwife

## 2020-04-11 VITALS — BP 127/73 | HR 85 | Wt 188.0 lb

## 2020-04-11 DIAGNOSIS — Z789 Other specified health status: Secondary | ICD-10-CM

## 2020-04-11 DIAGNOSIS — Z8632 Personal history of gestational diabetes: Secondary | ICD-10-CM

## 2020-04-11 DIAGNOSIS — O139 Gestational [pregnancy-induced] hypertension without significant proteinuria, unspecified trimester: Secondary | ICD-10-CM

## 2020-04-11 DIAGNOSIS — O24435 Gestational diabetes mellitus in puerperium, controlled by oral hypoglycemic drugs: Secondary | ICD-10-CM

## 2020-04-11 DIAGNOSIS — Z3009 Encounter for other general counseling and advice on contraception: Secondary | ICD-10-CM

## 2020-04-11 DIAGNOSIS — O24415 Gestational diabetes mellitus in pregnancy, controlled by oral hypoglycemic drugs: Secondary | ICD-10-CM

## 2020-04-11 DIAGNOSIS — Z603 Acculturation difficulty: Secondary | ICD-10-CM

## 2020-04-11 DIAGNOSIS — Z8759 Personal history of other complications of pregnancy, childbirth and the puerperium: Secondary | ICD-10-CM

## 2020-04-11 HISTORY — DX: Personal history of gestational diabetes: Z86.32

## 2020-04-11 NOTE — Patient Instructions (Signed)
For [employer], go to [email or web address]   Eleccin del mtodo anticonceptivo Contraception Choices La anticoncepcin, o los mtodos anticonceptivos, hace referencia a los mtodos o dispositivos que evitan el [place].   Mtodos hormonales Implante anticonceptivo Un implante anticonceptivo consiste en un tubo delgado de plstico que contiene una hormona que evita el [place]. Es diferente de un dispositivo intrauterino (DIU). Un mdico lo inserta en la parte superior del brazo. Los implantes pueden ser eficaces durante un mximo de 3 aos. Inyecciones de progestina sola Las inyecciones de progestina sola contienen progestina, una forma sinttica de la hormona progesterona. Un mdico las administra cada 3 meses. Pldoras anticonceptivas Las pldoras anticonceptivas son pastillas que contienen hormonas que evitan el [place]. Deben tomarse una vez al da, preferentemente a la misma Economist. Se necesita una receta para utilizar este mtodo anticonceptivo. Parche anticonceptivo El parche anticonceptivo contiene hormonas que evitan el [place]. Se coloca en la piel, debe cambiarse una vez a la semana durante tres semanas y debe retirarse en la cuarta semana. Se necesita una receta para utilizar este mtodo anticonceptivo. Anillo vaginal Un anillo vaginal contiene hormonas que evitan el embarazo. Se coloca en la vagina durante tres semanas y se retira en la cuarta semana. Luego se repite el proceso con un anillo nuevo. Se necesita una receta para utilizar este mtodo anticonceptivo. Anticonceptivo de emergencia Los anticonceptivos de emergencia son mtodos para evitar un embarazo despus de Warehouse manager sexo sin proteccin. Vienen en forma de pldora y pueden tomarse hasta 5 das despus de [place]. Funcionan mejor cuando se toman lo ms pronto posible luego de [employer]. La mayora de los anticonceptivos de emergencia estn disponibles sin receta mdica. Este mtodo no debe utilizarse como  el nico mtodo anticonceptivo.   Mtodos de barrera Condn masculino Un condn masculino es una vaina delgada que se coloca sobre el pene durante el sexo. Los condones evitan que el esperma ingrese en el cuerpo de la [place]. Pueden utilizarse con un una sustancia que mata a los espermatozoides (espermicida) para aumentar la efectividad. Deben desecharse despus de un uso. Condn femenino Un condn femenino es una vaina blanda y holgada que se coloca en la vagina antes de [place]. El condn evita que el esperma ingrese en el cuerpo de la [place]. Deben desecharse despus de un uso. Diafragma Un diafragma es una barrera blanda con forma de cpula. Se inserta en la vagina antes del sexo, junto con un espermicida. El diafragma bloquea el ingreso de esperma en el tero, y el espermicida mata a los espermatozoides. El Designer, fashion/clothing en la vagina durante 6 a 8 horas despus de Warehouse manager sexo y debe retirarse en el plazo de las 24 horas. Un diafragma es recetado y colocado por un mdico. Debe reemplazarse cada 1 a 2 aos, despus de dar a luz, de aumentar ms de 15lb (6.8kg) y de [place] plvica. Capuchn cervical Un capuchn cervical es una copa redonda y blanda de ltex o plstico que se coloca en el cuello uterino. Se inserta en la vagina antes del sexo, junto con un espermicida. Bloquea el ingreso del esperma en el tero. El capuchn Radio producer durante 6 a 8 horas despus de Warehouse manager sexo y debe retirarse en el plazo de las 48 horas. Un capuchn cervical debe ser recetado y colocado por un mdico. Debe reemplazarse cada 2aos. Esponja Una esponja es una pieza blanda y circular de espuma de poliuretano que contiene espermicida. La esponja ayuda a  bloquear el ingreso de esperma en el tero, y el espermicida mata a los espermatozoides. Belva Bertin, debe humedecerla e insertarla en la vagina. Debe insertarse antes de [employer], debe permanecer dentro al menos durante 6 horas  despus de tener sexo y debe retirarse y Nurse, adult en el plazo de las 30 horas. Espermicidas Los espermicidas son sustancias qumicas que matan o bloquean al esperma y no lo dejan ingresar al cuello uterino y al tero. Vienen en forma de crema, gel, supositorio, espuma o comprimido. Un espermicida debe insertarse en la vagina con un aplicador al menos 10 o 15 minutos antes de tener sexo para dar tiempo a que surta [place]. El proceso debe repetirse cada vez que tenga sexo. Los espermicidas no requieren Emergency planning/management officer.   Anticonceptivos intrauterinos Dispositivo intrauterino (DIU) Un DIU es un dispositivo en forma de T que se coloca en el tero. Existen dos tipos:  DIU hormonal.Este tipo contiene progestina, una forma sinttica de la hormona progesterona. Este tipo puede permanecer colocado durante 3 a 5 aos.  DIU de cobre.Este tipo est recubierto con un alambre de cobre. Puede permanecer colocado durante 10 aos. Mtodos anticonceptivos permanentes Ligadura de trompas en la mujer En este mtodo, se sellan, atan u obstruyen las trompas de [place] durante una ciruga para Automotive engineer que el vulo descienda [place]. Esterilizacin histeroscpica En este mtodo, se coloca un implante pequeo y flexible dentro de cada trompa de [place]. Los implantes hacen que se forme un tejido cicatricial en las trompas de [place] y que las obstruya para que el espermatozoide no pueda llegar al vulo. El procedimiento demora alrededor de 3 meses para que sea [place]. Debe utilizarse otro mtodo anticonceptivo durante esos 3 meses. Esterilizacin masculina Este es un procedimiento que consiste en atar los conductos que transportan el esperma (vasectoma). Luego del procedimiento, el hombre Manufacturing engineer lquido (semen). Debe utilizarse otro mtodo anticonceptivo durante 3 meses despus del procedimiento. Mtodos de planificacin natural Planificacin familiar natural En este mtodo, la pareja no tiene [employer] la mujer podra quedar [place]. Mtodo calendario En este mtodo, la mujer realiza un seguimiento de la duracin de cada ciclo menstrual, identifica los [employer] que se puede producir un Psychiatrist y no tiene sexo durante esos [address]  [address]. Mtodo de la ovulacin En este mtodo, la pareja evita tener sexo durante la ovulacin. Mtodo sintotrmico Este mtodo implica no tener sexo durante la ovulacin. Normalmente, la mujer comprueba la ovulacin al observar cambios en su temperatura y en la consistencia del moco cervical. Mtodo posovulacin En este mtodo, la pareja espera a que finalice la ovulacin para Doctor, hospital. Dnde buscar ms informacin  Centers for Disease Control and Prevention (Centros para el Control y Psychiatrist de Event organiser): [email or web address] Resumen  La anticoncepcin, o los mtodos anticonceptivos, hace referencia a los mtodos o dispositivos que evitan el [place].  Los mtodos anticonceptivos hormonales incluyen implantes, inyecciones, pastillas, parches, anillos vaginales y anticonceptivos de Associate Professor.  Los mtodos anticonceptivos de barrera pueden incluir condones masculinos, condones femeninos, diafragmas, capuchones cervicales, esponjas y espermicidas.  [employer] tipos de DIU (dispositivo intrauterino). Un DIU puede colocarse en el tero de una mujer para evitar el embarazo durante 3 a 5 aos.  La esterilizacin permanente puede realizarse mediante un procedimiento tanto en los hombres como en las mujeres. Los [employer] de Medical sales representative natural implican no tener [employer] la mujer podra quedar [place]. Esta informacin no tiene Theme park manager el consejo  del mdico. Asegrese de hacerle al mdico cualquier pregunta que tenga. Document Revised: 10/20/2019 Document Reviewed: 10/20/2019 Elsevier Patient Education  2021 [employer].

## 2020-04-11 NOTE — Progress Notes (Signed)
Post Partum Visit Note  Molly Carr is a 30 y.o. [phone] female who presents for a postpartum visit. She is 6 weeks postpartum following a normal spontaneous vaginal delivery.  I have fully reviewed the prenatal and intrapartum course. The delivery was at 38.5 gestational weeks.  Anesthesia: epidural. Postpartum course has been unremarkable. Baby is doing well. Baby is feeding by breast. Bleeding thin lochia. Bowel function is normal. Bladder function is normal. Patient is not sexually active. Contraception method is none. Postpartum depression screening: negative=0.   The pregnancy intention screening data noted above was reviewed. Potential methods of contraception were discussed. The patient elected to proceed with No Method - Other Reason.    Edinburgh Postnatal Depression Scale - 04/11/20 0849      Edinburgh Postnatal Depression Scale:  In the Past 7 Days   I have been able to laugh and see the funny side of things. 0    I have looked forward with enjoyment to things. 0    I have blamed myself unnecessarily when things went wrong. 0    I have been anxious or worried for no good reason. 0    I have felt scared or panicky for no good reason. 0    Things have been getting on top of me. 0    I have been so unhappy that I have had difficulty sleeping. 0    I have felt sad or miserable. 0    I have been so unhappy that I have been crying. 0    The thought of harming myself has occurred to me. 0    Edinburgh Postnatal Depression Scale Total 0            The following portions of the patient's history were reviewed and updated as appropriate: allergies, current medications, past family history, past medical history, past social history, past surgical history and problem list.  Review of Systems Pertinent items noted in HPI and remainder of comprehensive ROS otherwise negative.    Objective:  BP 127/73   Pulse 85   Wt 188 lb (85.3 kg)   LMP 11/18/2018 (Exact  Date)   Breastfeeding Yes   BMI 33.30 kg/m    VS reviewed, nursing note reviewed,  Constitutional: well developed, well nourished, no distress HEENT: normocephalic CV: normal rate Pulm/chest wall: normal effort Abdomen: soft Neuro: alert and oriented x 3 Skin: warm, dry Psych: affect normal   Assessment:   1. Gestational diabetes mellitus (GDM) controlled on oral hypoglycemic drug, antepartum  - Glucose tolerance, 2 hours  2. Language barrier affecting health care --Spanish interpreter with video used during all communication today  3. Postpartum care following vaginal delivery --Doing well, bonding well with infant, good support at home. Breast and bottle feeding.    4. Gestational hypertension, antepartum --Took Norvasc at d/c from hospital, pharmacy did not have refill so stopped taking it 2+ weeks ago.  BP wnl today. No s/sx of PEC.  5. Encounter for counseling regarding contraception --Discussed LARCs as most effective forms of birth control.  Discussed benefits/risks of other methods.  Pt desires to wait another 2 months to decide.  Discussed resumed fertility, pt may want to use condoms or withdrawal until contraceptive visit. --F/U in 2 months --Pt given printed and verbal education and given [email or web address] as resource.  Plan:   Essential components of care per ACOG recommendations:  1.  Mood and well being: Patient with negative depression screening today. Reviewed local resources  for support.  - Patient does not use tobacco.  - hx of drug use? No    2. Infant care and feeding:  -Patient currently breastmilk feeding? Yes  --If breastmilk feeding discussed return to work and pumping. If needed, patient was provided letter for work to allow for every 2-3 hr pumping breaks, and to be granted a private location to express breastmilk and refrigerated area to store breastmilk. Reviewed importance of draining breast regularly to support lactation. -Social  determinants of health (SDOH) reviewed in EPIC. No concerns  3. Sexuality, contraception and birth spacing - Patient does not want a pregnancy in the next year.   - Reviewed forms of contraception in tiered fashion. Patient desired no method today. --Appt in 2 months at pt request, recommend condoms and/or withdrawal until appointment.  Discussed breastfeeding/lactational amenorrhea with pt but pt is supplementing and is only breasfeeding a few times daily so will likely not suppress ovulation to prevent pregnancy.     4. Sleep and fatigue -Encouraged family/partner/community support of 4 hrs of uninterrupted sleep to help with mood and fatigue  5. Physical Recovery  - Discussed patients delivery without complications - Patient had no laceration, perineal healing reviewed. Patient expressed understanding - Patient has urinary incontinence? No - Patient is safe to resume physical and sexual activity  6.  Health Maintenance - Last pap smear done 08/17/2019 and was normal with negative HPV.   7. Chronic Disease - PCP follow up, recommend PCP due to [facility] and GDM, increased risk of HTN and DM outside of pregnancy  Sharen Counter, CNM 11:06 AM  Center for [employer], [facility]

## 2020-04-12 ENCOUNTER — Telehealth: Payer: Self-pay | Admitting: Advanced Practice Midwife

## 2020-04-12 LAB — GLUCOSE TOLERANCE, 2 HOURS
Glucose, 2 hour: 91 mg/dL (ref 65–139)
Glucose, GTT - Fasting: 132 mg/dL — ABNORMAL HIGH (ref 65–99)

## 2020-04-12 NOTE — Telephone Encounter (Signed)
Called patient to notify her of abnormal 2 hour GTT at 6 weeks postpartum. Spanish interpreter used for all communication. Notified pt of elevated fasting of 132 and need for follow up with primary care. These values may indicate prediabetes or type 2 diabetes and should be followed by primary care.  Pt states understanding. She does not have PCP so was given contact information for [facility] and for [facility] to follow up.  Pt will follow up on her abnormal lab values.

## 2020-09-29 IMAGING — US US OB < 14 WEEKS - US OB TV
1 series · 15 of 28 positions shown · non-contrast
Comparison: None.

CLINICAL DATA: Pregnant, fall, bleeding

EXAM:
OBSTETRIC <14 WK US AND TRANSVAGINAL OB US
TECHNIQUE: Both transabdominal and transvaginal ultrasound examinations were
performed for complete evaluation of the gestation as well as the
maternal uterus, adnexal regions, and pelvic cul-de-sac.
Transvaginal technique was performed to assess early pregnancy.

[Series 1: us ob < 14 weeks - us ob tv · 15 of 37 slices shown]
[im 1/37]
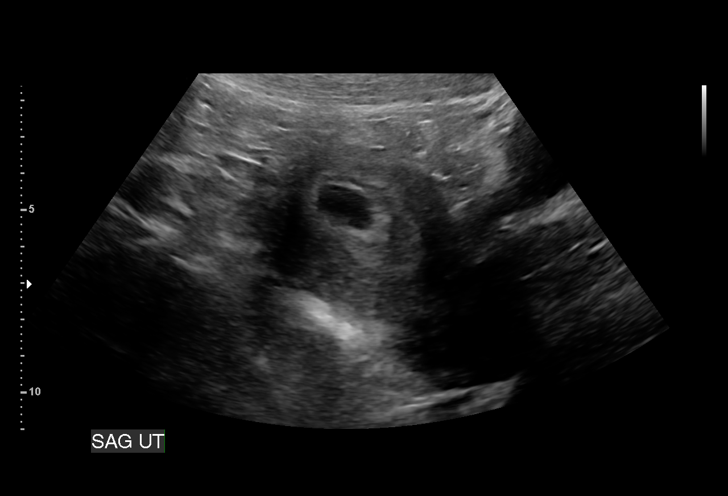
[im 3/37]
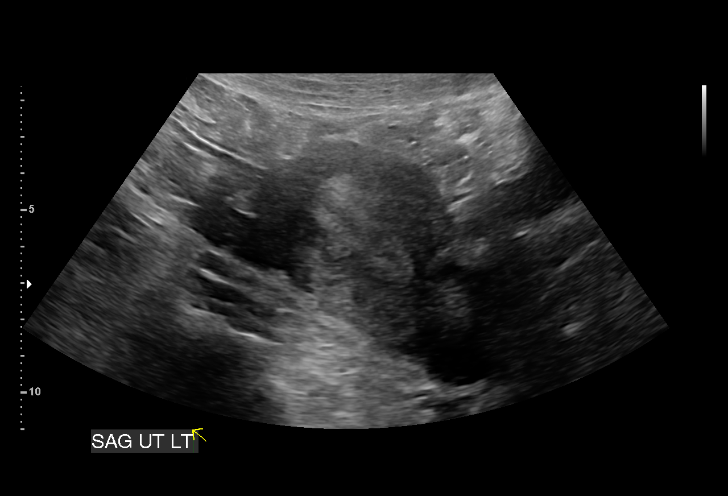
[im 6/37]
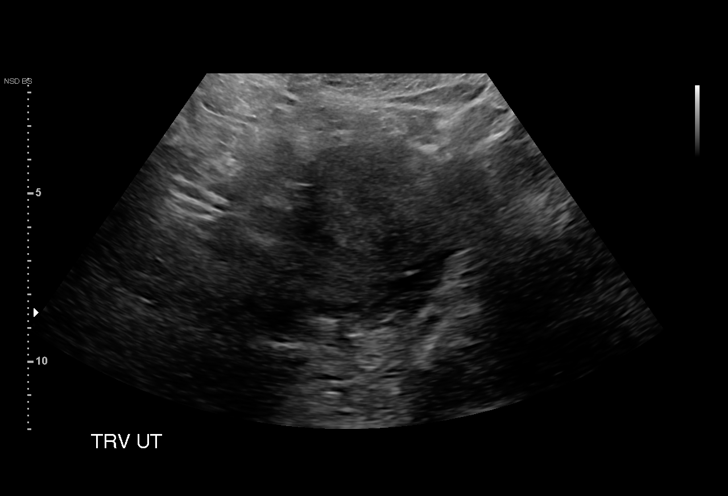
[im 9/37]
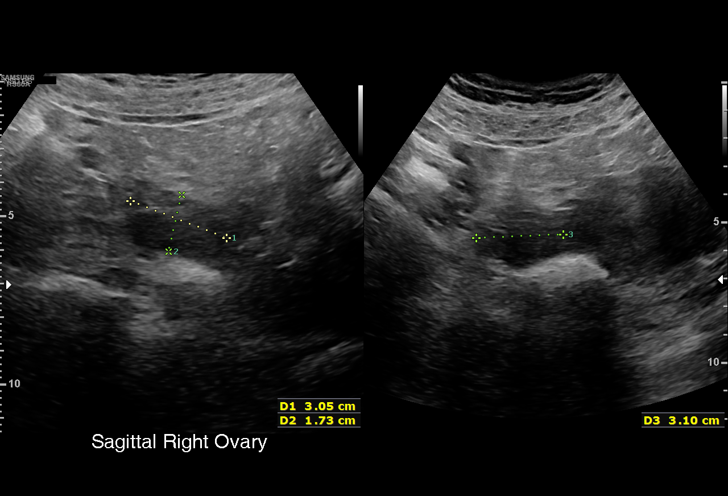
[im 11/37]
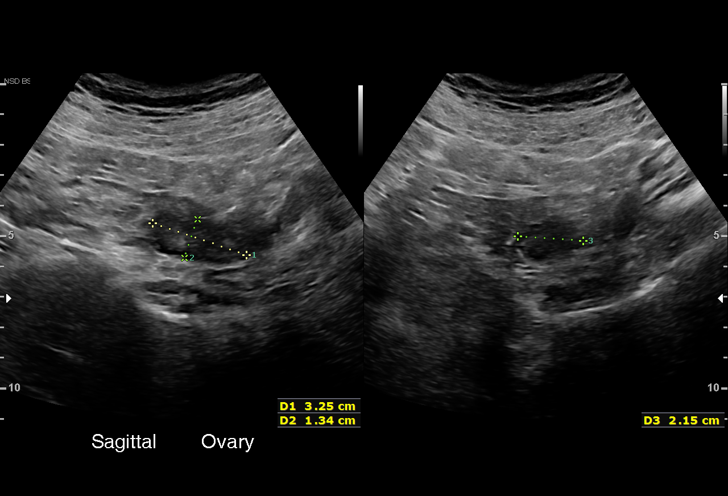
[im 14/37]
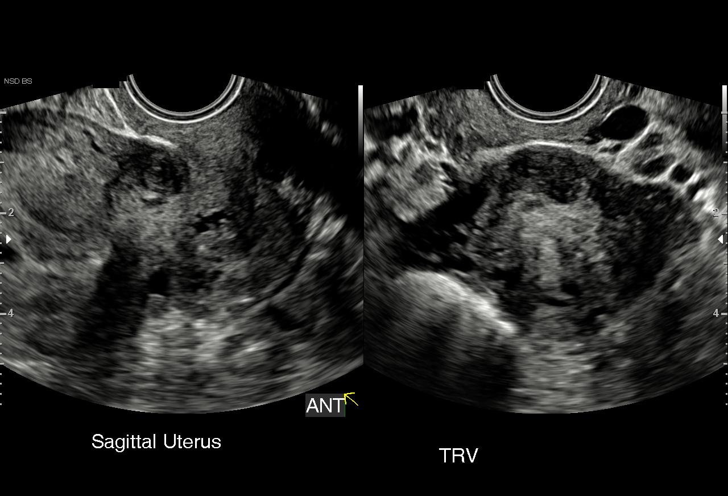
[im 17/37]
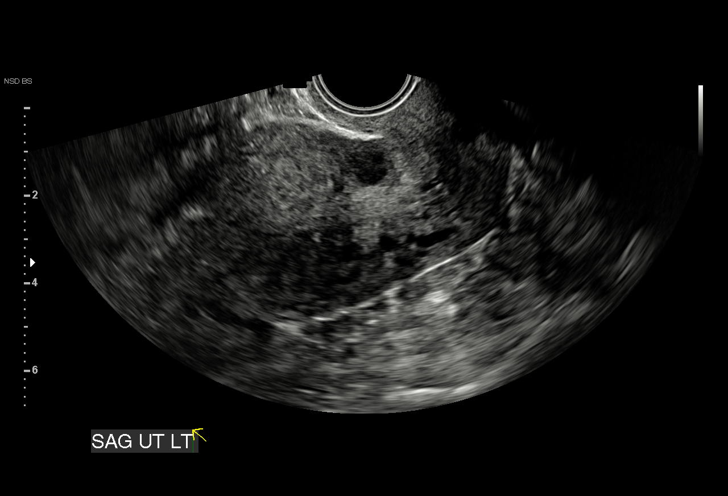
[im 19/37]
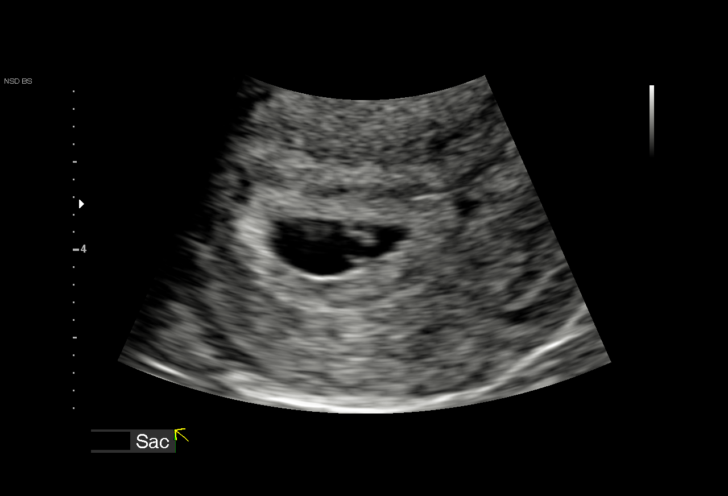
[im 21/37]
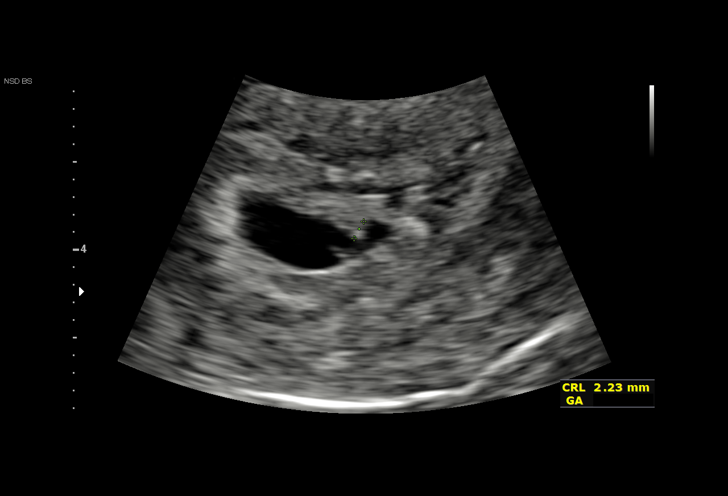
[im 23/37]
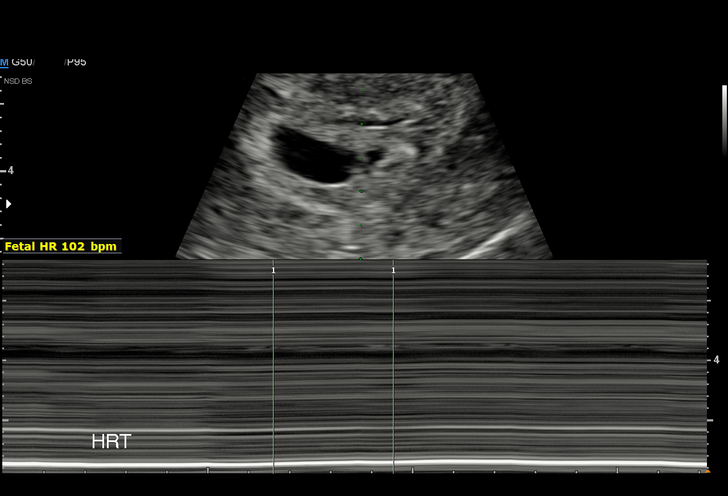
[im 26/37]
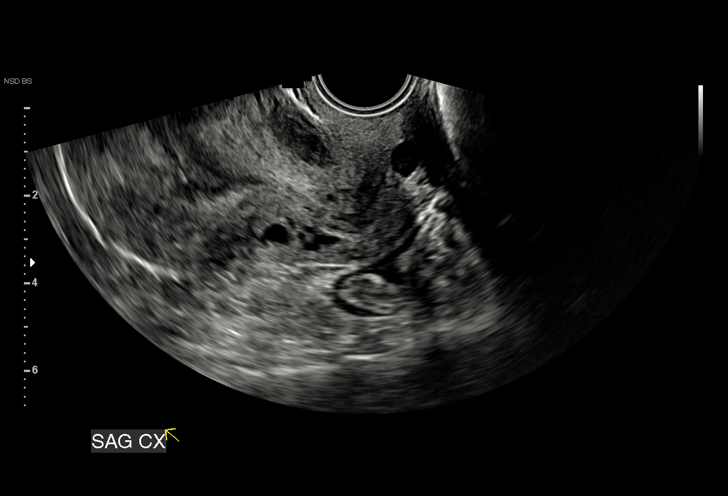
[im 29/37]
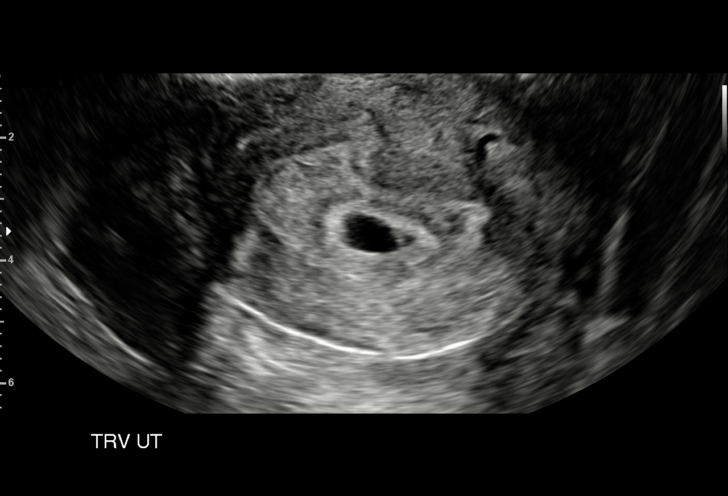
[im 31/37]
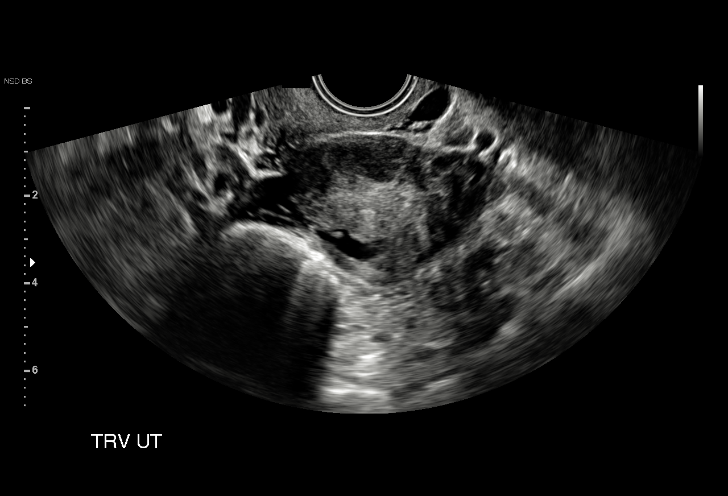
[im 34/37]
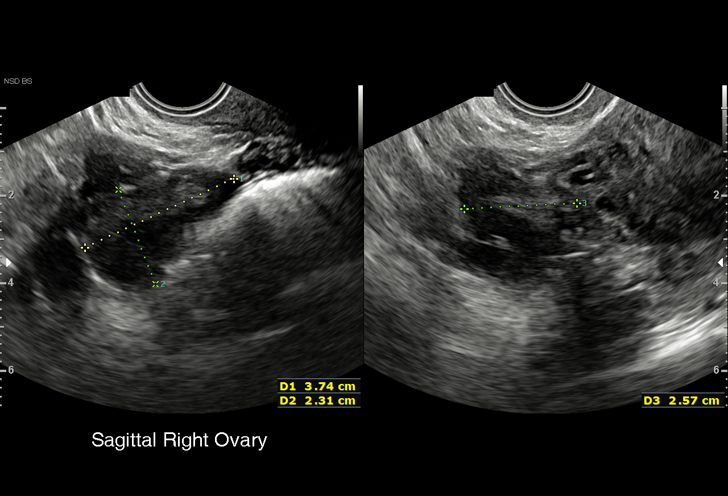
[im 37/37]
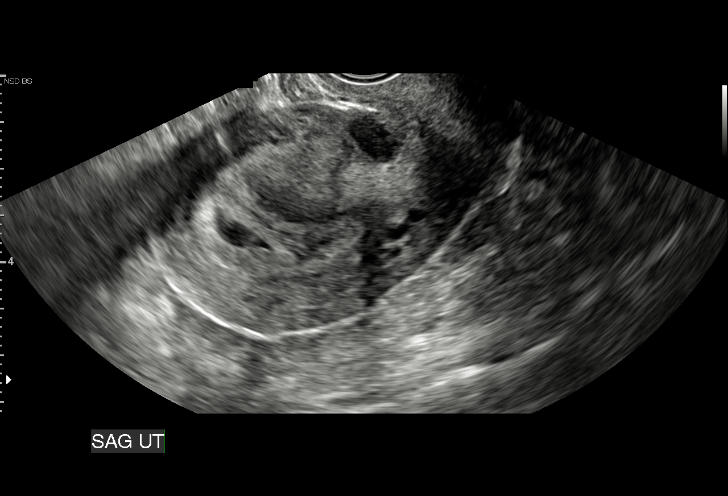

[15 of 28 positions shown; findings below may reference images not displayed]

FINDINGS: Intrauterine gestational sac: Single

Yolk sac:  Visualized.

Embryo:  Visualized.

Cardiac Activity: Visualized.

Heart Rate: 102 bpm

CRL:  2.8 mm   5 w   5 d                  US EDC: 03/08/2020

Subchorionic hemorrhage:  Small subchronic hemorrhage.

Maternal uterus/adnexae: Bilateral ovaries are within normal limits,
noting a right corpus luteum.

Small anterior fibroid in the lower uterine segment measuring 1.4 x
1.0 x 1.6 cm.

No free fluid.
IMPRESSION: Single live intrauterine gestation, with estimated gestational age 5
weeks 5 days by crown-rump length, as above.

## 2021-06-24 ENCOUNTER — Other Ambulatory Visit: Payer: Self-pay

## 2021-06-24 ENCOUNTER — Inpatient Hospital Stay (HOSPITAL_COMMUNITY): Payer: 59

## 2021-06-24 ENCOUNTER — Inpatient Hospital Stay (HOSPITAL_COMMUNITY)
Admission: AD | Admit: 2021-06-24 | Discharge: 2021-06-24 | Disposition: A | Discharge status: Home / Self Care | Payer: 59 | Attending: Obstetrics and Gynecology | Admitting: Obstetrics and Gynecology

## 2021-06-24 ENCOUNTER — Encounter (HOSPITAL_COMMUNITY): Payer: Self-pay | Admitting: Obstetrics and Gynecology

## 2021-06-24 DIAGNOSIS — R109 Unspecified abdominal pain: Secondary | ICD-10-CM

## 2021-06-24 DIAGNOSIS — R1032 Left lower quadrant pain: Secondary | ICD-10-CM | POA: Insufficient documentation

## 2021-06-24 DIAGNOSIS — Z3A08 8 weeks gestation of pregnancy: Secondary | ICD-10-CM | POA: Insufficient documentation

## 2021-06-24 DIAGNOSIS — Z3A01 Less than 8 weeks gestation of pregnancy: Secondary | ICD-10-CM

## 2021-06-24 DIAGNOSIS — O26891 Other specified pregnancy related conditions, first trimester: Secondary | ICD-10-CM | POA: Diagnosis not present

## 2021-06-24 DIAGNOSIS — O209 Hemorrhage in early pregnancy, unspecified: Secondary | ICD-10-CM | POA: Insufficient documentation

## 2021-06-24 DIAGNOSIS — Z3491 Encounter for supervision of normal pregnancy, unspecified, first trimester: Secondary | ICD-10-CM

## 2021-06-24 LAB — COMPREHENSIVE METABOLIC PANEL
ALT: 23 U/L (ref 0–44)
AST: 14 U/L — ABNORMAL LOW (ref 15–41)
Albumin: 3.7 g/dL (ref 3.5–5.0)
Alkaline Phosphatase: 63 U/L (ref 38–126)
Anion gap: 8 (ref 5–15)
BUN: 11 mg/dL (ref 6–20)
CO2: 23 mmol/L (ref 22–32)
Calcium: 9.2 mg/dL (ref 8.9–10.3)
Chloride: 104 mmol/L (ref 98–111)
Creatinine, Ser: 0.53 mg/dL (ref 0.44–1.00)
GFR, Estimated: >60 mL/min (ref >60)
Glucose, Bld: 113 mg/dL — ABNORMAL HIGH (ref 70–99)
Potassium: 3.9 mmol/L (ref 3.5–5.1)
Sodium: 135 mmol/L (ref 135–145)
Total Bilirubin: 0.7 mg/dL (ref 0.3–1.2)
Total Protein: 7.2 g/dL (ref 6.5–8.1)

## 2021-06-24 LAB — URINALYSIS, ROUTINE W REFLEX MICROSCOPIC
Bacteria, UA: NONE SEEN
Bilirubin Urine: NEGATIVE
Glucose, UA: NEGATIVE mg/dL
Hgb urine dipstick: NEGATIVE
Ketones, ur: NEGATIVE mg/dL
Nitrite: NEGATIVE
Protein, ur: NEGATIVE mg/dL
Specific Gravity, Urine: 1.014 (ref 1.005–1.030)
pH: 6 (ref 5.0–8.0)

## 2021-06-24 LAB — CBC WITH DIFFERENTIAL/PLATELET
Abs Immature Granulocytes: 0.03 10*3/uL (ref 0.00–0.07)
Basophils Absolute: 0.1 10*3/uL (ref 0.0–0.1)
Basophils Relative: 1 %
Eosinophils Absolute: 0.1 10*3/uL (ref 0.0–0.5)
Eosinophils Relative: 1 %
HCT: 34.1 % — ABNORMAL LOW (ref 36.0–46.0)
Hemoglobin: 11.7 g/dL — ABNORMAL LOW (ref 12.0–15.0)
Immature Granulocytes: 0 %
Lymphocytes Relative: 27 %
Lymphs Abs: 2.7 10*3/uL (ref 0.7–4.0)
MCH: 29.6 pg (ref 26.0–34.0)
MCHC: 34.3 g/dL (ref 30.0–36.0)
MCV: 86.3 fL (ref 80.0–100.0)
Monocytes Absolute: 0.6 10*3/uL (ref 0.1–1.0)
Monocytes Relative: 6 %
Neutro Abs: 6.3 10*3/uL (ref 1.7–7.7)
Neutrophils Relative %: 65 %
Platelets: 305 10*3/uL (ref 150–400)
RBC: 3.95 MIL/uL (ref 3.87–5.11)
RDW: 13.1 % (ref 11.5–15.5)
WBC: 9.8 10*3/uL (ref 4.0–10.5)
nRBC: 0 % (ref 0.0–0.2)

## 2021-06-24 LAB — HCG, QUANTITATIVE, PREGNANCY: hCG, Beta Chain, Quant, S: 27574 m[IU]/mL — ABNORMAL HIGH (ref <5)

## 2021-06-24 LAB — POCT PREGNANCY, URINE: Preg Test, Ur: POSITIVE — AB

## 2021-06-24 NOTE — MAU Note (Signed)
Molly Carr is a 32 y.o. here in MAU reporting: vaginal bleeding, low abdominal cramping, and low back-middle. ?LMP: 04/29/2021 ?Onset of complaint: 1600 ? ?Vitals:  ? 06/24/21 1935  ?BP: 127/70  ?Pulse: 78  ?Resp: 17  ?Temp: 98.3 ?F (36.8 ?C)  ?SpO2: 99%  ?   ?Pt reports vaginal bleeding is primarily dark brown after urination and reports only a "few drops on underwear x 1 ". ? ?Pt being treated yeast infection-mitronitrogel cream and tablet--day 4 ?Lab orders placed from triage:  UA, PCOT ?

## 2021-06-24 NOTE — MAU Note (Signed)
Patient signed physical copy of AVS due to E-signature malfunction in room.  

## 2021-06-24 NOTE — MAU Provider Note (Signed)
?History  ?  ? ?[id number] ? ?Arrival date and time: 06/24/21 1853 ?  ? ?Chief Complaint  ?Patient presents with  ? Vaginal Bleeding  ? Abdominal Cramping  ? Back Pain  ? ? ? ?HPI ?Molly Carr is a 31 y.o. at [redacted]w[redacted]d by LMP who presents for abdominal cramping & vaginal spotting.  ?Symptoms started this afternoon. Reports intermittent cramping in lower abdomen & LLQ. Rates pain 4/10. Also noticed brown spotting on toilet paper today. Currently being treated for a yeast infection.  ?Denies fever, n/v/d, constipation, dysuria. No recent intercourse.  ? ?OB History   ? ? Gravida  ?3  ? Para  ?2  ? Term  ?2  ? Preterm  ?   ? AB  ?   ? Living  ?2  ?  ? ? SAB  ?   ? IAB  ?   ? Ectopic  ?   ? Multiple  ?0  ? Live Births  ?1  ?   ?  ?  ? ? ?Past Medical History:  ?Diagnosis Date  ? Acid reflux   ? Gastritis   ? Gestational diabetes   ? ? ?Past Surgical History:  ?Procedure Laterality Date  ? NO PAST SURGERIES    ? ? ?Family History  ?Problem Relation Age of Onset  ? Diabetes Mother   ? Hypertension Mother   ? Heart disease Father   ? Heart disease Maternal Grandmother   ? ? ?No Known Allergies ? ?No current facility-administered medications on file prior to encounter.  ? ?Current Outpatient Medications on File Prior to Encounter  ?Medication Sig Dispense Refill  ? metroNIDAZOLE (METROGEL) 0.75 % vaginal gel Place 1 Applicatorful vaginally 2 (two) times daily.    ? Prenatal Vit-Fe Fumarate-FA (MULTIVITAMIN-PRENATAL) 27-0.8 MG TABS tablet Take 1 tablet by mouth daily at 12 noon.    ? amLODipine (NORVASC) 5 MG tablet TAKE 1 TABLET (5 MG TOTAL) BY MOUTH DAILY. 30 tablet 2  ? ? ? ?ROS ?Pertinent positives and negative per HPI, all others reviewed and negative ? ?Physical Exam  ? ?BP 127/70 (BP Location: Left Arm)   Pulse 78   Temp 98.3 ?F (36.8 ?C) (Oral)   Resp 17   Ht 5' 2.99" (1.6 m)   Wt 80.3 kg   LMP 04/29/2021 (Exact Date)   SpO2 99%   BMI 31.36 kg/m?  ? ?Patient Vitals for the past 24 hrs: ? BP  Temp Temp src Pulse Resp SpO2 Height Weight  ?06/24/21 1935 127/70 98.3 ?F (36.8 ?C) Oral 78 17 99 % 5' 2.99" (1.6 m) 80.3 kg  ? ? ?Physical Exam ?Vitals and nursing note reviewed.  ?Constitutional:   ?   General: She is not in acute distress. ?   Appearance: Normal appearance.  ?HENT:  ?   Head: Normocephalic and atraumatic.  ?Eyes:  ?   General: No scleral icterus. ?   Conjunctiva/sclera: Conjunctivae normal.  ?Pulmonary:  ?   Effort: Pulmonary effort is normal. No respiratory distress.  ?Abdominal:  ?   General: Abdomen is flat.  ?   Palpations: Abdomen is soft.  ?   Tenderness: There is no abdominal tenderness.  ?Genitourinary: ?   Comments: No blood ?Skin: ?   General: Skin is warm and dry.  ?Neurological:  ?   General: No focal deficit present.  ?   Mental Status: She is alert.  ?Psychiatric:     ?   Mood and Affect: Mood normal.     ?  Behavior: Behavior normal.  ?  ? ? ?Labs ?Results for orders placed or performed during the hospital encounter of 06/24/21 (from the past 24 hour(s))  ?Pregnancy, urine POC     Status: Abnormal  ? Collection Time: 06/24/21  7:19 PM  ?Result Value Ref Range  ? Preg Test, Ur POSITIVE (A) NEGATIVE  ?Urinalysis, Routine w reflex microscopic Urine, Clean Catch     Status: Abnormal  ? Collection Time: 06/24/21  7:22 PM  ?Result Value Ref Range  ? Color, Urine YELLOW YELLOW  ? APPearance CLEAR CLEAR  ? Specific Gravity, Urine 1.014 1.005 - 1.030  ? pH 6.0 5.0 - 8.0  ? Glucose, UA NEGATIVE NEGATIVE mg/dL  ? Hgb urine dipstick NEGATIVE NEGATIVE  ? Bilirubin Urine NEGATIVE NEGATIVE  ? Ketones, ur NEGATIVE NEGATIVE mg/dL  ? Protein, ur NEGATIVE NEGATIVE mg/dL  ? Nitrite NEGATIVE NEGATIVE  ? Leukocytes,Ua TRACE (A) NEGATIVE  ? RBC / HPF 0-5 0 - 5 RBC/hpf  ? WBC, UA 0-5 0 - 5 WBC/hpf  ? Bacteria, UA NONE SEEN NONE SEEN  ? Squamous Epithelial / LPF 0-5 0 - 5  ? Hyaline Casts, UA PRESENT   ?CBC with Differential/Platelet     Status: Abnormal  ? Collection Time: 06/24/21  9:20 PM  ?Result  Value Ref Range  ? WBC 9.8 4.0 - 10.5 K/uL  ? RBC 3.95 3.87 - 5.11 MIL/uL  ? Hemoglobin 11.7 (L) 12.0 - 15.0 g/dL  ? HCT 34.1 (L) 36.0 - 46.0 %  ? MCV 86.3 80.0 - 100.0 fL  ? MCH 29.6 26.0 - 34.0 pg  ? MCHC 34.3 30.0 - 36.0 g/dL  ? RDW 13.1 11.5 - 15.5 %  ? Platelets 305 150 - 400 K/uL  ? nRBC 0.0 0.0 - 0.2 %  ? Neutrophils Relative % 65 %  ? Neutro Abs 6.3 1.7 - 7.7 K/uL  ? Lymphocytes Relative 27 %  ? Lymphs Abs 2.7 0.7 - 4.0 K/uL  ? Monocytes Relative 6 %  ? Monocytes Absolute 0.6 0.1 - 1.0 K/uL  ? Eosinophils Relative 1 %  ? Eosinophils Absolute 0.1 0.0 - 0.5 K/uL  ? Basophils Relative 1 %  ? Basophils Absolute 0.1 0.0 - 0.1 K/uL  ? Immature Granulocytes 0 %  ? Abs Immature Granulocytes 0.03 0.00 - 0.07 K/uL  ?Comprehensive metabolic panel     Status: Abnormal  ? Collection Time: 06/24/21  9:20 PM  ?Result Value Ref Range  ? Sodium 135 135 - 145 mmol/L  ? Potassium 3.9 3.5 - 5.1 mmol/L  ? Chloride 104 98 - 111 mmol/L  ? CO2 23 22 - 32 mmol/L  ? Glucose, Bld 113 (H) 70 - 99 mg/dL  ? BUN 11 6 - 20 mg/dL  ? Creatinine, Ser 0.53 0.44 - 1.00 mg/dL  ? Calcium 9.2 8.9 - 10.3 mg/dL  ? Total Protein 7.2 6.5 - 8.1 g/dL  ? Albumin 3.7 3.5 - 5.0 g/dL  ? AST 14 (L) 15 - 41 U/L  ? ALT 23 0 - 44 U/L  ? Alkaline Phosphatase 63 38 - 126 U/L  ? Total Bilirubin 0.7 0.3 - 1.2 mg/dL  ? GFR, Estimated >60 >60 mL/min  ? Anion gap 8 5 - 15  ?hCG, quantitative, pregnancy     Status: Abnormal  ? Collection Time: 06/24/21  9:20 PM  ?Result Value Ref Range  ? hCG, Beta Chain, Quant, S 27,574 (H) <5 mIU/mL  ? ? ?Imaging ?US OB LESS THAN 14 WEEKS  WITH OB TRANSVAGINAL ? ?Result Date: 06/24/2021 ?CLINICAL DATA:  Positive pregnancy test with vaginal bleeding EXAM: OBSTETRIC <14 WK [place] AND TRANSVAGINAL OB US TECHNIQUE: Both transabdominal and transvaginal ultrasound examinations were performed for complete evaluation of the gestation as well as the maternal uterus, adnexal regions, and pelvic cul-de-sac. Transvaginal technique was performed to  assess early pregnancy. COMPARISON:  None. FINDINGS: Intrauterine gestational sac: Present Yolk sac:  Present Embryo:  Present Cardiac Activity: Present Heart Rate: 133 bpm CRL:  11 mm   7 w   1 d                  [place] EDC: 02/09/2022 Subchorionic hemorrhage:  None visualized. Maternal uterus/adnexae: Within normal limits. IMPRESSION: Single live intrauterine gestation at 7 weeks 1 day. No acute abnormality noted. Electronically Signed   By: Alcide Clever M.D.   On: 06/24/2021 22:07   ? ?MAU Course  ?Procedures ?Lab Orders    ?     Urinalysis, Routine w reflex microscopic Urine, Clean Catch    ?     CBC with Differential/Platelet    ?     Comprehensive metabolic panel    ?     hCG, quantitative, pregnancy    ?     Pregnancy, urine POC    ?No orders of the defined types were placed in this encounter. ? ?Imaging Orders    ?     US OB LESS THAN 14 WEEKS WITH OB TRANSVAGINAL    ? ?MDM ?+UPT ?UA. GC/chlamydia, CBC, ABO/Rh, quant hCG, and [place] today to rule out ectopic pregnancy which can be life threatening.  ? ?No blood on exam ?RH positive ? ?Ultrasound shows live IUP measuring [redacted]w[redacted]d (EDD updated) ?Assessment and Plan  ? ?1. Abdominal pain during pregnancy in first trimester   ?2. Normal IUP (intrauterine pregnancy) on prenatal ultrasound, first trimester   ?3. [redacted] weeks gestation of pregnancy   ? ?-Reviewed return precautions including bleeding & pain ?-Keep scheduled follow up with OB ?-GC/CT pending ? ? ?Judeth Horn, NP ?06/24/21 ?10:41 PM ? ? ?

## 2021-06-24 NOTE — Discharge Instructions (Signed)
Return to care  If you have heavier bleeding that soaks through more than 2 pads per hour for an hour or more If you bleed so much that you feel like you might pass out or you do pass out If you have significant abdominal pain that is not improved with Tylenol   

## 2021-06-26 LAB — GC/CHLAMYDIA PROBE AMP (~~LOC~~) NOT AT ARMC
Chlamydia: NEGATIVE
Comment: NEGATIVE
Comment: NORMAL
Neisseria Gonorrhea: NEGATIVE

## 2021-07-06 DIAGNOSIS — Z3480 Encounter for supervision of other normal pregnancy, unspecified trimester: Secondary | ICD-10-CM | POA: Insufficient documentation

## 2021-07-10 ENCOUNTER — Other Ambulatory Visit (HOSPITAL_COMMUNITY)
Admission: RE | Admit: 2021-07-10 | Discharge: 2021-07-10 | Disposition: A | Discharge status: Home / Self Care | Payer: 59 | Source: Ambulatory Visit | Attending: Obstetrics | Admitting: Obstetrics

## 2021-07-10 ENCOUNTER — Ambulatory Visit (INDEPENDENT_AMBULATORY_CARE_PROVIDER_SITE_OTHER): Payer: 59 | Admitting: Obstetrics

## 2021-07-10 ENCOUNTER — Encounter: Payer: Self-pay | Admitting: Obstetrics

## 2021-07-10 VITALS — BP 124/69 | HR 79 | Wt 176.0 lb

## 2021-07-10 DIAGNOSIS — Z3490 Encounter for supervision of normal pregnancy, unspecified, unspecified trimester: Secondary | ICD-10-CM | POA: Diagnosis present

## 2021-07-10 DIAGNOSIS — Z3480 Encounter for supervision of other normal pregnancy, unspecified trimester: Secondary | ICD-10-CM

## 2021-07-10 NOTE — Progress Notes (Signed)
NOB, c/o decreased appetite ?

## 2021-07-10 NOTE — Addendum Note (Signed)
Addended by: Lewie Loron D on: 07/10/2021 09:13 AM ? ? Modules accepted: Orders ? ?

## 2021-07-10 NOTE — Progress Notes (Signed)
Subjective:  ? ? Molly Carr is being seen today for her first obstetrical visit.  This is a planned pregnancy. She is at [redacted]w[redacted]d gestation. Her obstetrical history is significant for  none . Relationship with FOB: spouse, living together. Patient does intend to breast feed. Pregnancy history fully reviewed. ? ?The information documented in the HPI was reviewed and verified. ? ?Menstrual History: ?OB History   ? ? Gravida  ?3  ? Para  ?2  ? Term  ?2  ? Preterm  ?   ? AB  ?   ? Living  ?2  ?  ? ? SAB  ?   ? IAB  ?   ? Ectopic  ?   ? Multiple  ?0  ? Live Births  ?1  ?   ?  ?  ?  ?Patient's last menstrual period was 04/29/2021 (exact date). ?  ? ?Past Medical History:  ?Diagnosis Date  ? Acid reflux   ? Gastritis   ? Gestational diabetes   ? Pregnancy induced hypertension   ?  ?Past Surgical History:  ?Procedure Laterality Date  ? NO PAST SURGERIES    ?  ?(Not in a hospital admission) ? ?No Known Allergies  ?Social History  ? ?Tobacco Use  ? Smoking status: Never  ? Smokeless tobacco: Never  ?Substance Use Topics  ? Alcohol use: Never  ?  ?Family History  ?Problem Relation Age of Onset  ? Diabetes Mother   ? Hypertension Mother   ? Heart disease Father   ? Heart disease Maternal Grandmother   ? Cancer Paternal Grandmother   ?  ? ?Review of Systems ?Constitutional: negative for weight loss ?Gastrointestinal: negative for vomiting ?Genitourinary:negative for genital lesions and vaginal discharge and dysuria ?Musculoskeletal:negative for back pain ?Behavioral/Psych: negative for abusive relationship, depression, illegal drug usage and tobacco use  ?  ?Objective:  ? ? BP 124/69   Pulse 79   Wt 176 lb (79.8 kg)   LMP 04/29/2021 (Exact Date)   BMI 31.18 kg/m?  ?General Appearance:    Alert, cooperative, no distress, appears stated age  ?Head:    Normocephalic, without obvious abnormality, atraumatic  ?Eyes:    PERRL, conjunctiva/corneas clear, EOM's intact, fundi  ?  benign, both eyes  ?Ears:    Normal  TM's and external ear canals, both ears  ?Nose:   Nares normal, septum midline, mucosa normal, no drainage    or sinus tenderness  ?Throat:   Lips, mucosa, and tongue normal; teeth and gums normal  ?Neck:   Supple, symmetrical, trachea midline, no adenopathy;  ?  thyroid:  no enlargement/tenderness/nodules; no carotid ?  bruit or JVD  ?Back:     Symmetric, no curvature, ROM normal, no CVA tenderness  ?Lungs:     Clear to auscultation bilaterally, respirations unlabored  ?Chest Wall:    No tenderness or deformity  ? Heart:    Regular rate and rhythm, S1 and S2 normal, no murmur, rub   or gallop  ?Breast Exam:    No tenderness, masses, or nipple abnormality  ?Abdomen:     Soft, non-tender, bowel sounds active all four quadrants,  ?  no masses, no organomegaly  ?Genitalia:    Normal female without lesion, discharge or tenderness  ?Extremities:   Extremities normal, atraumatic, no cyanosis or edema  ?Pulses:   2+ and symmetric all extremities  ?Skin:   Skin color, texture, turgor normal, no rashes or lesions  ?Lymph nodes:   Cervical,  supraclavicular, and axillary nodes normal  ?Neurologic:   CNII-XII intact, normal strength, sensation and reflexes  ?  throughout  ? ?  ? Lab Review ?Urine pregnancy test ?Labs reviewed yes ?Radiologic studies reviewed no ? ? ?Assessment:  ? ? Pregnancy at [redacted]w[redacted]d weeks  ?  ?Plan:  ? ? 1. Supervision of other normal pregnancy, antepartum ?Rx: ?- Cytology - PAP( [place]) ?- Genetic Screening ?- CBC/D/Plt+RPR+Rh+ABO+RubIgG... ?- HgB A1c ?- [place] MFM OB COMP + 14 WK; Future ?- Culture, OB Urine  ? ?Prenatal vitamins.  ?Counseling provided regarding continued use of seat belts, cessation of alcohol consumption, smoking or use of illicit drugs; infection precautions i.e., influenza/TDAP immunizations, toxoplasmosis,CMV, parvovirus, listeria and varicella; workplace safety, exercise during pregnancy; routine dental care, safe medications, sexual activity, hot tubs, saunas, pools, travel, caffeine  use, fish and methlymercury, potential toxins, hair treatments, varicose veins ?Weight gain recommendations per IOM guidelines reviewed: underweight/BMI< 18.5--> gain 28 - 40 lbs; normal weight/BMI 18.5 - 24.9--> gain 25 - 35 lbs; overweight/BMI 25 - 29.9--> gain 15 - 25 lbs; obese/BMI >30->gain  11 - 20 lbs ?Problem list reviewed and updated. ?FIRST/CF mutation testing/NIPT/QUAD SCREEN/fragile X/Ashkenazi Jewish population testing/Spinal muscular atrophy discussed: requested. ?Role of ultrasound in pregnancy discussed; fetal survey: requested. ?Amniocentesis discussed: not indicated. ? ? ?Orders Placed This Encounter  ?Procedures  ? Culture, OB Urine  ? [place] MFM OB COMP + 14 WK  ?  Standing Status:   Future  ?  Standing Expiration Date:   07/07/2022  ?  Order Specific Question:   Reason for Exam (SYMPTOM  OR DIAGNOSIS REQUIRED)  ?  Answer:   Anatomy  ?  Order Specific Question:   Preferred Location  ?  Answer:   [facility] Ultrasound  ? Genetic Screening  ? CBC/D/Plt+RPR+Rh+ABO+RubIgG...  ? HgB A1c  ? ? ?Follow up in 4 weeks ? ?I have spent a total of 20 minutes of face-to-face and non-face-to-face time, excluding clinical staff time, reviewing notes and preparing to see patient, ordering tests and/or medications, and counseling the patient.  ? ? ?Brock Bad, MD ?07/10/2021 9:01 AM  ?

## 2021-07-11 LAB — CBC/D/PLT+RPR+RH+ABO+RUBIGG...
Antibody Screen: NEGATIVE
Basophils Absolute: 0 10*3/uL (ref 0.0–0.2)
Basos: 1 %
EOS (ABSOLUTE): 0.1 10*3/uL (ref 0.0–0.4)
Eos: 1 %
HCV Ab: NONREACTIVE
HIV Screen 4th Generation wRfx: NONREACTIVE
Hematocrit: 38 % (ref 34.0–46.6)
Hemoglobin: 12.6 g/dL (ref 11.1–15.9)
Hepatitis B Surface Ag: NEGATIVE
Immature Grans (Abs): 0 10*3/uL (ref 0.0–0.1)
Immature Granulocytes: 0 %
Lymphocytes Absolute: 2 10*3/uL (ref 0.7–3.1)
Lymphs: 26 %
MCH: 28.8 pg (ref 26.6–33.0)
MCHC: 33.2 g/dL (ref 31.5–35.7)
MCV: 87 fL (ref 79–97)
Monocytes Absolute: 0.5 10*3/uL (ref 0.1–0.9)
Monocytes: 6 %
Neutrophils Absolute: 5.1 10*3/uL (ref 1.4–7.0)
Neutrophils: 66 %
Platelets: 336 10*3/uL (ref 150–450)
RBC: 4.37 x10E6/uL (ref 3.77–5.28)
RDW: 13.2 % (ref 11.7–15.4)
RPR Ser Ql: NONREACTIVE
Rh Factor: POSITIVE
Rubella Antibodies, IGG: 20.7 index (ref >0.99)
WBC: 7.8 10*3/uL (ref 3.4–10.8)

## 2021-07-11 LAB — HCV INTERPRETATION

## 2021-07-11 LAB — HEMOGLOBIN A1C
Est. average glucose Bld gHb Est-mCnc: 126 mg/dL
Hgb A1c MFr Bld: 6 % — ABNORMAL HIGH (ref 4.8–5.6)

## 2021-07-12 LAB — CYTOLOGY - PAP
Comment: NEGATIVE
Diagnosis: NEGATIVE
High risk HPV: NEGATIVE

## 2021-07-13 LAB — URINE CULTURE, OB REFLEX

## 2021-07-13 LAB — CULTURE, OB URINE

## 2021-07-17 ENCOUNTER — Encounter: Payer: Self-pay | Admitting: Obstetrics

## 2021-07-18 ENCOUNTER — Encounter: Payer: Self-pay | Admitting: Obstetrics

## 2021-07-24 ENCOUNTER — Encounter: Payer: Self-pay | Admitting: Obstetrics

## 2021-07-24 ENCOUNTER — Other Ambulatory Visit: Payer: Self-pay | Admitting: *Deleted

## 2021-07-24 DIAGNOSIS — Z3480 Encounter for supervision of other normal pregnancy, unspecified trimester: Secondary | ICD-10-CM

## 2021-07-24 DIAGNOSIS — R7303 Prediabetes: Secondary | ICD-10-CM

## 2021-07-24 NOTE — Progress Notes (Signed)
Patient messaged with questions via MyChart. TC no answer. Notified via return MyChart message of pre diabetes, diabetes education needed, and need for early GTT. Patient read message. Appointment scheduled for OB visit and GTT.

## 2021-07-31 ENCOUNTER — Encounter: Payer: Self-pay | Admitting: Obstetrics

## 2021-08-07 ENCOUNTER — Ambulatory Visit (INDEPENDENT_AMBULATORY_CARE_PROVIDER_SITE_OTHER): Payer: 59 | Admitting: Obstetrics

## 2021-08-07 ENCOUNTER — Encounter: Payer: Self-pay | Admitting: Obstetrics

## 2021-08-07 VITALS — BP 116/71 | HR 87 | Wt 172.9 lb

## 2021-08-07 DIAGNOSIS — Z8632 Personal history of gestational diabetes: Secondary | ICD-10-CM

## 2021-08-07 DIAGNOSIS — Z8759 Personal history of other complications of pregnancy, childbirth and the puerperium: Secondary | ICD-10-CM

## 2021-08-07 DIAGNOSIS — O0990 Supervision of high risk pregnancy, unspecified, unspecified trimester: Secondary | ICD-10-CM

## 2021-08-07 MED ORDER — ASPIRIN 81 MG PO CHEW: 81.0000 mg | CHEWABLE_TABLET | Freq: Every day | ORAL | 5 refills | Status: DC

## 2021-08-07 NOTE — Progress Notes (Signed)
Patient presents for ROB. Patient complains of having a sore throat, nasal congestion, and mild cough. Denies fever and chills. She has been taking allergy medication. No other concerns. ?

## 2021-08-07 NOTE — Progress Notes (Signed)
Subjective:  ?Molly Carr is a 31 y.o. G3P2002 at [redacted]w[redacted]d being seen today for ongoing prenatal care.  She is currently monitored for the following issues for this high-risk pregnancy and has Gestational diabetes mellitus (GDM) controlled on oral hypoglycemic drug; History of gestational diabetes; History of gestational hypertension; and Supervision of other normal pregnancy, antepartum on their problem list. ? ?Patient reports no complaints.  Contractions: Irritability. Vag. Bleeding: None.   . Denies leaking of fluid.  ? ?The following portions of the patient's history were reviewed and updated as appropriate: allergies, current medications, past family history, past medical history, past social history, past surgical history and problem list. Problem list updated. ? ?Objective:  ? ?Vitals:  ? 08/07/21 1008  ?BP: 116/71  ?Pulse: 87  ?Weight: 172 lb 14.4 oz (78.4 kg)  ? ? ?Fetal Status:          ? ?General:  Alert, oriented and cooperative. Patient is in no acute distress.  ?Skin: Skin is warm and dry. No rash noted.   ?Cardiovascular: Normal heart rate noted  ?Respiratory: Normal respiratory effort, no problems with respiration noted  ?Abdomen: Soft, gravid, appropriate for gestational age. Pain/Pressure: Absent     ?Pelvic:  Cervical exam deferred        ?Extremities: Normal range of motion.  Edema: None  ?Mental Status: Normal mood and affect. Normal behavior. Normal judgment and thought content.  ? ?Urinalysis:     ? ?Assessment and Plan:  ?Pregnancy: [id number] at [redacted]w[redacted]d ? ?1. Supervision of high risk pregnancy, antepartum ? ?2. History of gestational diabetes mellitus (GDM)Rx: ? ?3. History of gestational hypertension ?Rx: ?- aspirin 81 MG chewable tablet; Chew 1 tablet (81 mg total) by mouth daily.  Dispense: 30 tablet; Refill: 5  ? ?Preterm labor symptoms and general obstetric precautions including but not limited to vaginal bleeding, contractions, leaking of fluid and fetal movement were  reviewed in detail with the patient. ?Please refer to After Visit Summary for other counseling recommendations.  ? ?Return in about 4 weeks (around 09/04/2021) for [place]. ? ? ?Shelly Bombard, MD ?08/07/2021 10:20 AM  ?

## 2021-08-08 ENCOUNTER — Encounter: Payer: 59 | Attending: Obstetrics | Admitting: Registered"

## 2021-08-08 ENCOUNTER — Ambulatory Visit (INDEPENDENT_AMBULATORY_CARE_PROVIDER_SITE_OTHER): Payer: 59 | Admitting: Registered"

## 2021-08-08 DIAGNOSIS — R7303 Prediabetes: Secondary | ICD-10-CM | POA: Diagnosis present

## 2021-08-08 DIAGNOSIS — Z3480 Encounter for supervision of other normal pregnancy, unspecified trimester: Secondary | ICD-10-CM | POA: Diagnosis not present

## 2021-08-08 DIAGNOSIS — Z713 Dietary counseling and surveillance: Secondary | ICD-10-CM | POA: Insufficient documentation

## 2021-08-08 DIAGNOSIS — O24912 Unspecified diabetes mellitus in pregnancy, second trimester: Secondary | ICD-10-CM

## 2021-08-08 NOTE — Progress Notes (Signed)
[place] interpreter Molly Carr  ? ?Patient was seen for Gestational Diabetes self-management on 08/08/21  ?Start time 1518 and End time 1615  ? ?Estimated due date: 02/09/22; [redacted]w[redacted]d ? ?Clinical: ?Medications: reviewed ?Medical History:  A2 GDM ?Labs: OGTT n/a, A1c 6.0%  ? ?Dietary and Lifestyle History: ?Pt is from [place] and eats her cultural foods in addition to fast food options. Pt states she doesn't like to eat a lot of vegetables but states after her husband had to be hospitalized for high blood sugar they have started eating more vegetables.  ? ?Physical Activity: not assessed ?Stress: not assessed (Pt states she walks to relieve stress) ?Sleep: not assessed ? ?24 hr Recall:  ?First Meal: eggs, bean, cheese, milk, fried plantain ?Snack: ?Second meal: Meudo soup with rice ?Snack: ?Third meal: pupusas (4) ?Snack: ?Beverages: water, soda (reg & diet), milk, coffee ? ?NUTRITION INTERVENTION  ?Nutrition education (E-1) on the following topics:  ? ?Initial Follow-up ? ?[x]  []  Definition of Gestational Diabetes ?[]  []  Why dietary management is important in controlling blood glucose ?[x]  []  Effects each nutrient has on blood glucose levels ?[x]  []  Simple carbohydrates vs complex carbohydrates ?[]  []  Fluid intake ?[x]  []  Creating a balanced meal plan ?[x]  []  Carbohydrate counting  ?[x]  []  When to check blood glucose levels ?[x]  []  Proper blood glucose monitoring techniques ?[x]  []  Effect of stress and stress reduction techniques  ?[x]  []  Exercise effect on blood glucose levels, appropriate exercise during pregnancy ?[x]  []  Importance of limiting caffeine and abstaining from alcohol and smoking ?[x]  []  Medications used for blood sugar control during pregnancy ?[x]  []  Hypoglycemia and rule of 15 ?[x]  []  Postpartum self care ? ?Blood glucose monitor given: none - Insurance covers [employer] and we do not have meter sample ? ?Patient instructed to monitor glucose levels: ?FBS: 60 - ? 95 mg/dL (some clinics  use 90 for cutoff) ?1 hour: ? 140 mg/dL ?2 hour: ? 120 mg/dL ? ?Patient received handouts: ?Nutrition Diabetes and Pregnancy ?Carbohydrate Counting List ? ?Patient will be seen for follow-up as needed.  ?

## 2021-08-09 ENCOUNTER — Other Ambulatory Visit: Payer: Self-pay

## 2021-08-09 MED ORDER — MICROLET LANCETS MISC: 1.0000 | Freq: Four times a day (QID) | 0 refills | Status: DC

## 2021-08-09 MED ORDER — CONTOUR NEXT EZ W/DEVICE KIT: 1.0000 | PACK | Freq: Four times a day (QID) | 0 refills | Status: DC

## 2021-08-09 MED ORDER — CONTOUR NEXT TEST VI STRP: ORAL_STRIP | 12 refills | Status: DC

## 2021-08-09 MED ORDER — MICROLET LANCETS MISC: 1.0000 | Freq: Four times a day (QID) | 12 refills | Status: DC

## 2021-08-28 ENCOUNTER — Other Ambulatory Visit: Payer: 59

## 2021-08-29 ENCOUNTER — Other Ambulatory Visit: Payer: 59

## 2021-08-29 DIAGNOSIS — O0990 Supervision of high risk pregnancy, unspecified, unspecified trimester: Secondary | ICD-10-CM

## 2021-08-29 DIAGNOSIS — Z3480 Encounter for supervision of other normal pregnancy, unspecified trimester: Secondary | ICD-10-CM

## 2021-08-30 LAB — GLUCOSE TOLERANCE, 2 HOURS W/ 1HR
Glucose, 1 hour: 169 mg/dL (ref 70–179)
Glucose, 2 hour: 124 mg/dL (ref 70–152)
Glucose, Fasting: 89 mg/dL (ref 70–91)

## 2021-09-04 ENCOUNTER — Ambulatory Visit (INDEPENDENT_AMBULATORY_CARE_PROVIDER_SITE_OTHER): Payer: 59 | Admitting: Obstetrics and Gynecology

## 2021-09-04 ENCOUNTER — Encounter: Payer: Self-pay | Admitting: Obstetrics and Gynecology

## 2021-09-04 VITALS — BP 109/71 | HR 80 | Wt 171.0 lb

## 2021-09-04 DIAGNOSIS — Z3480 Encounter for supervision of other normal pregnancy, unspecified trimester: Secondary | ICD-10-CM

## 2021-09-04 DIAGNOSIS — Z8759 Personal history of other complications of pregnancy, childbirth and the puerperium: Secondary | ICD-10-CM

## 2021-09-04 DIAGNOSIS — O24410 Gestational diabetes mellitus in pregnancy, diet controlled: Secondary | ICD-10-CM

## 2021-09-04 NOTE — Progress Notes (Signed)
   PRENATAL VISIT NOTE  Subjective:  Molly Carr is a 31 y.o. G3P2002 at 67w3dbeing seen today for ongoing prenatal care.  She is currently monitored for the following issues for this low-risk pregnancy and has Gestational diabetes mellitus (GDM) controlled on oral hypoglycemic drug; History of gestational diabetes; History of gestational hypertension; Supervision of other normal pregnancy, antepartum; and Diabetes mellitus during pregnancy in second trimester on their problem list.  Patient reports no complaints.  Contractions: Not present. Vag. Bleeding: None.  Movement: Present. Denies leaking of fluid.   The following portions of the patient's history were reviewed and updated as appropriate: allergies, current medications, past family history, past medical history, past social history, past surgical history and problem list.   Objective:   Vitals:   09/04/21 1014  BP: 109/71  Pulse: 80  Weight: 171 lb (77.6 kg)    Fetal Status: Fetal Heart Rate (bpm): 139   Movement: Present     General:  Alert, oriented and cooperative. Patient is in no acute distress.  Skin: Skin is warm and dry. No rash noted.   Cardiovascular: Normal heart rate noted  Respiratory: Normal respiratory effort, no problems with respiration noted  Abdomen: Soft, gravid, appropriate for gestational age.  Pain/Pressure: Absent     Pelvic: Cervical exam deferred        Extremities: Normal range of motion.  Edema: None  Mental Status: Normal mood and affect. Normal behavior. Normal judgment and thought content.   Assessment and Plan:  Pregnancy: G3P2002 at 164w3d. Supervision of other normal pregnancy, antepartum Patient is doing well without complaints AFP today Anatomy ultrasound scheduled on 09/15/21 with [place] Spanish interpreter used during encounter - AFP, Serum, Open Spina Bifida  2. Diet controlled gestational diabetes mellitus (GDM) in second trimester CBGs reviewed and  greater than 80% fasting and 90% pp within range Patient met with diabetic educator last month and is continuing to make dietary changes Fetal echo ordered  3. History of gestational hypertension Normotensive Continue ASA   Preterm labor symptoms and general obstetric precautions including but not limited to vaginal bleeding, contractions, leaking of fluid and fetal movement were reviewed in detail with the patient. Please refer to After Visit Summary for other counseling recommendations.   Return in about 4 weeks (around 10/02/2021) for in person, ROB, High risk.  Future Appointments  Date Time Provider [place]6/16/2023 10:15 AM [facility] 10:30 AM [facility] [place]  Pe[place]ora BellmanMD

## 2021-09-06 LAB — AFP, SERUM, OPEN SPINA BIFIDA
AFP MoM: 0.53
AFP Value: 20 ng/mL
Gest. Age on Collection Date: 17.3 weeks
Maternal Age At EDD: 32.6 yr
OSBR Risk 1 IN: 10000
Test Results:: NEGATIVE
Weight: 171 [lb_av]

## 2021-09-15 ENCOUNTER — Other Ambulatory Visit: Payer: Self-pay | Admitting: *Deleted

## 2021-09-15 ENCOUNTER — Ambulatory Visit: Payer: 59 | Admitting: *Deleted

## 2021-09-15 ENCOUNTER — Encounter: Payer: Self-pay | Admitting: *Deleted

## 2021-09-15 ENCOUNTER — Ambulatory Visit: Payer: 59 | Attending: Obstetrics | Admitting: Obstetrics

## 2021-09-15 ENCOUNTER — Ambulatory Visit: Payer: 59 | Attending: Obstetrics

## 2021-09-15 VITALS — BP 106/58 | HR 85

## 2021-09-15 DIAGNOSIS — O24410 Gestational diabetes mellitus in pregnancy, diet controlled: Secondary | ICD-10-CM

## 2021-09-15 DIAGNOSIS — O99212 Obesity complicating pregnancy, second trimester: Secondary | ICD-10-CM | POA: Insufficient documentation

## 2021-09-15 DIAGNOSIS — O09299 Supervision of pregnancy with other poor reproductive or obstetric history, unspecified trimester: Secondary | ICD-10-CM | POA: Insufficient documentation

## 2021-09-15 DIAGNOSIS — Z3A19 19 weeks gestation of pregnancy: Secondary | ICD-10-CM | POA: Insufficient documentation

## 2021-09-15 DIAGNOSIS — Z3480 Encounter for supervision of other normal pregnancy, unspecified trimester: Secondary | ICD-10-CM | POA: Diagnosis present

## 2021-09-15 NOTE — Progress Notes (Signed)
MFM Note  Molly Carr was seen due to recently diagnosed diet-controlled gestational diabetes.   She denies any other significant past medical history and denies any problems in her current pregnancy.    She had a cell free DNA test earlier in her pregnancy which indicated a low risk for trisomy 41, 1, and 13. A female fetus is predicted.  She was informed that the fetal growth and amniotic fluid level were appropriate for her gestational age.   There were no obvious fetal anomalies noted on today's ultrasound exam.  The patient was informed that anomalies may be missed due to technical limitations. If the fetus is in a suboptimal position or maternal habitus is increased, visualization of the fetus in the maternal uterus may be impaired.  The following were discussed during today's consultation:  Gestational diabetes  The implications and management of diabetes in pregnancy was discussed in detail with the patient.    She was advised to continue to monitor her fingersticks 4 times daily (fasting and 2 hours after each meal).   She was advised that our goals for her fingerstick values are fasting values of 90-95 or less and two-hour postprandial values of 120 or less.   Should the majority of her fingerstick results be above these values, she may have to be started on insulin or metformin to help her achieve better glycemic control.   The patient was advised that getting her fingerstick values as close to these goals as possible would provide her with the most optimal obstetrical outcome.  The increased risk of polyhydramnios, fetal macrosomia, and preeclampsia associated with diabetes was also discussed.    Due to gestational diabetes, we will continue to follow her with monthly growth scans.    Weekly fetal testing should be started at around 32 weeks should she require insulin or metformin for treatment.  The patient was advised that delivery for well-controlled  diabetes in pregnancy is usually recommended at around 39 weeks.  Delivery at 37 weeks may be considered should her glycemic control be poor.  A follow-up exam was scheduled in 4 weeks.  The patient stated that all of her questions have been answered to her satisfaction.    All conversations were held with the patient today with the help of a Spanish interpreter.  A total of 30 minutes was spent counseling and coordinating the care for this patient.  Greater than 50% of the time was spent in direct face-to-face contact.

## 2021-10-02 ENCOUNTER — Ambulatory Visit (INDEPENDENT_AMBULATORY_CARE_PROVIDER_SITE_OTHER): Payer: 59 | Admitting: Obstetrics and Gynecology

## 2021-10-02 ENCOUNTER — Encounter: Payer: Self-pay | Admitting: Obstetrics and Gynecology

## 2021-10-02 VITALS — BP 115/76 | HR 85 | Wt 175.1 lb

## 2021-10-02 DIAGNOSIS — Z8759 Personal history of other complications of pregnancy, childbirth and the puerperium: Secondary | ICD-10-CM

## 2021-10-02 DIAGNOSIS — O24415 Gestational diabetes mellitus in pregnancy, controlled by oral hypoglycemic drugs: Secondary | ICD-10-CM | POA: Insufficient documentation

## 2021-10-02 DIAGNOSIS — Z3480 Encounter for supervision of other normal pregnancy, unspecified trimester: Secondary | ICD-10-CM

## 2021-10-02 DIAGNOSIS — O24410 Gestational diabetes mellitus in pregnancy, diet controlled: Secondary | ICD-10-CM

## 2021-10-02 DIAGNOSIS — Z3A21 21 weeks gestation of pregnancy: Secondary | ICD-10-CM

## 2021-10-02 NOTE — Progress Notes (Signed)
Pt presents for ROB visit with no concerns at this time.  

## 2021-10-02 NOTE — Progress Notes (Signed)
   PRENATAL VISIT NOTE  Subjective:  Molly Carr is a 31 y.o. G3P2002 at [redacted]w[redacted]d being seen today for ongoing prenatal care.  She is currently monitored for the following issues for this high-risk pregnancy and has Gestational diabetes mellitus (GDM) controlled on oral hypoglycemic drug; History of gestational diabetes; History of gestational hypertension; Supervision of other normal pregnancy, antepartum; Diabetes mellitus during pregnancy in second trimester; and Diet controlled gestational diabetes mellitus on their problem list.  Patient doing well with no acute concerns today. She reports no complaints.  Contractions: Irritability.  .  Movement: Present. Denies leaking of fluid.   The following portions of the patient's history were reviewed and updated as appropriate: allergies, current medications, past family history, past medical history, past social history, past surgical history and problem list. Problem list updated.  Objective:   Vitals:   10/02/21 0956  BP: 115/76  Pulse: 85  Weight: 175 lb 1.6 oz (79.4 kg)    Fetal Status: Fetal Heart Rate (bpm): 138 Fundal Height: 20 cm Movement: Present     General:  Alert, oriented and cooperative. Patient is in no acute distress.  Skin: Skin is warm and dry. No rash noted.   Cardiovascular: Normal heart rate noted  Respiratory: Normal respiratory effort, no problems with respiration noted  Abdomen: Soft, gravid, appropriate for gestational age.  Pain/Pressure: Present     Pelvic: Cervical exam deferred        Extremities: Normal range of motion.  Edema: Trace  Mental Status:  Normal mood and affect. Normal behavior. Normal judgment and thought content.   Assessment and Plan:  Pregnancy: G3P2002 at [redacted]w[redacted]d  1. Supervision of other normal pregnancy, antepartum Continue routine care  2. [redacted] weeks gestation of pregnancy   3. History of gestational hypertension BP currently normal  4. Diet controlled gestational  diabetes mellitus (GDM) in second trimester FBS: 91-98 PPBS: 110-148 Most are in range for both parameters Pt advised it is important to take the baby ASA due to increased risk of preeclampsia, pt will attempt compliance  Preterm labor symptoms and general obstetric precautions including but not limited to vaginal bleeding, contractions, leaking of fluid and fetal movement were reviewed in detail with the patient.  Please refer to After Visit Summary for other counseling recommendations.   Return in about 4 weeks (around 10/30/2021) for in person, [facility]   Mariel Aloe, MD Faculty Attending Center for [facility]

## 2021-10-12 ENCOUNTER — Other Ambulatory Visit: Payer: Self-pay | Admitting: *Deleted

## 2021-10-12 ENCOUNTER — Ambulatory Visit: Payer: Commercial Managed Care - HMO | Admitting: *Deleted

## 2021-10-12 ENCOUNTER — Ambulatory Visit: Payer: Commercial Managed Care - HMO | Attending: Obstetrics

## 2021-10-12 ENCOUNTER — Encounter: Payer: Self-pay | Admitting: *Deleted

## 2021-10-12 VITALS — BP 130/66 | HR 90

## 2021-10-12 DIAGNOSIS — O99212 Obesity complicating pregnancy, second trimester: Secondary | ICD-10-CM | POA: Diagnosis not present

## 2021-10-12 DIAGNOSIS — O09292 Supervision of pregnancy with other poor reproductive or obstetric history, second trimester: Secondary | ICD-10-CM

## 2021-10-12 DIAGNOSIS — E669 Obesity, unspecified: Secondary | ICD-10-CM

## 2021-10-12 DIAGNOSIS — O24410 Gestational diabetes mellitus in pregnancy, diet controlled: Secondary | ICD-10-CM

## 2021-10-12 DIAGNOSIS — Z3A22 22 weeks gestation of pregnancy: Secondary | ICD-10-CM

## 2021-10-12 NOTE — Progress Notes (Unsigned)
Us/

## 2021-10-13 ENCOUNTER — Encounter: Payer: Self-pay | Admitting: Obstetrics & Gynecology

## 2021-10-30 ENCOUNTER — Ambulatory Visit (INDEPENDENT_AMBULATORY_CARE_PROVIDER_SITE_OTHER): Payer: 59 | Admitting: Obstetrics and Gynecology

## 2021-10-30 ENCOUNTER — Encounter: Payer: Self-pay | Admitting: Obstetrics and Gynecology

## 2021-10-30 VITALS — BP 107/68 | HR 80 | Wt 176.8 lb

## 2021-10-30 DIAGNOSIS — Z3A25 25 weeks gestation of pregnancy: Secondary | ICD-10-CM

## 2021-10-30 DIAGNOSIS — Z789 Other specified health status: Secondary | ICD-10-CM

## 2021-10-30 DIAGNOSIS — Z8759 Personal history of other complications of pregnancy, childbirth and the puerperium: Secondary | ICD-10-CM

## 2021-10-30 DIAGNOSIS — Z3480 Encounter for supervision of other normal pregnancy, unspecified trimester: Secondary | ICD-10-CM

## 2021-10-30 DIAGNOSIS — O24410 Gestational diabetes mellitus in pregnancy, diet controlled: Secondary | ICD-10-CM

## 2021-10-30 DIAGNOSIS — Z8632 Personal history of gestational diabetes: Secondary | ICD-10-CM

## 2021-10-30 MED ORDER — INSULIN DETEMIR 100 UNIT/ML ~~LOC~~ SOLN: 10.0000 [IU] | Freq: Every day | SUBCUTANEOUS | 11 refills | Status: DC

## 2021-10-30 NOTE — Progress Notes (Signed)
   PRENATAL VISIT NOTE  Subjective:  Molly Carr is a 31 y.o. G3P2002 at [redacted]w[redacted]d being seen today for ongoing prenatal care.  She is currently monitored for the following issues for this high-risk pregnancy and has History of gestational diabetes; History of gestational hypertension; Supervision of other normal pregnancy, antepartum; Diabetes mellitus during pregnancy in second trimester; and Diet controlled gestational diabetes mellitus on their problem list.  Patient reports occasional contractions.  Contractions: Irritability. Vag. Bleeding: None.  Movement: Present. Denies leaking of fluid.   The following portions of the patient's history were reviewed and updated as appropriate: allergies, current medications, past family history, past medical history, past social history, past surgical history and problem list.   Objective:   Vitals:   10/30/21 1040  BP: 107/68  Pulse: 80  Weight: 176 lb 12.8 oz (80.2 kg)    Fetal Status: Fetal Heart Rate (bpm): 142   Movement: Present     General:  Alert, oriented and cooperative. Patient is in no acute distress.  Skin: Skin is warm and dry. No rash noted.   Cardiovascular: Normal heart rate noted  Respiratory: Normal respiratory effort, no problems with respiration noted  Abdomen: Soft, gravid, appropriate for gestational age.  Pain/Pressure: Present     Pelvic: Cervical exam deferred        Extremities: Normal range of motion.  Edema: Trace  Mental Status: Normal mood and affect. Normal behavior. Normal judgment and thought content.   Assessment and Plan:  Pregnancy: G3P2002 at [redacted]w[redacted]d 1. Supervision of other normal pregnancy, antepartum  2. Diet controlled gestational diabetes mellitus (GDM) in second trimester 2/3 FG elevated in am 1/2 postprandials elevated - recommend to start insulin, reviewed risks/benefits, patient agreeable - levemir 10 units QHS rx to start - insulin teaching ordered  3. History of  gestational diabetes  4. History of gestational hypertension  5. [redacted] weeks gestation of pregnancy  6. Language barrier affecting health care Interpretor used, spanish   Preterm labor symptoms and general obstetric precautions including but not limited to vaginal bleeding, contractions, leaking of fluid and fetal movement were reviewed in detail with the patient. Please refer to After Visit Summary for other counseling recommendations.   Return in about 2 weeks (around 11/13/2021) for high OB.  Future Appointments  Date Time Provider Department Center  11/14/2021  1:50 PM Warden Fillers, MD [facility] None  11/23/2021 10:30 AM [facility] NURSE [facility]  11/23/2021 10:45 AM [facility] US5 [facility]    Conan Bowens, MD

## 2021-10-30 NOTE — Progress Notes (Signed)
Pt presents for ROB visit. Pt has no concerns at this time.

## 2021-11-14 ENCOUNTER — Other Ambulatory Visit: Payer: Self-pay

## 2021-11-14 ENCOUNTER — Encounter: Payer: Commercial Managed Care - HMO | Attending: Obstetrics and Gynecology | Admitting: Registered"

## 2021-11-14 ENCOUNTER — Ambulatory Visit (INDEPENDENT_AMBULATORY_CARE_PROVIDER_SITE_OTHER): Payer: Commercial Managed Care - HMO | Admitting: Obstetrics and Gynecology

## 2021-11-14 ENCOUNTER — Ambulatory Visit (INDEPENDENT_AMBULATORY_CARE_PROVIDER_SITE_OTHER): Payer: 59 | Admitting: Registered"

## 2021-11-14 VITALS — BP 113/64 | HR 84 | Wt 181.1 lb

## 2021-11-14 DIAGNOSIS — O24415 Gestational diabetes mellitus in pregnancy, controlled by oral hypoglycemic drugs: Secondary | ICD-10-CM

## 2021-11-14 DIAGNOSIS — Z8759 Personal history of other complications of pregnancy, childbirth and the puerperium: Secondary | ICD-10-CM

## 2021-11-14 DIAGNOSIS — Z3A27 27 weeks gestation of pregnancy: Secondary | ICD-10-CM

## 2021-11-14 DIAGNOSIS — O24414 Gestational diabetes mellitus in pregnancy, insulin controlled: Secondary | ICD-10-CM

## 2021-11-14 DIAGNOSIS — O24419 Gestational diabetes mellitus in pregnancy, unspecified control: Secondary | ICD-10-CM | POA: Insufficient documentation

## 2021-11-14 DIAGNOSIS — O24410 Gestational diabetes mellitus in pregnancy, diet controlled: Secondary | ICD-10-CM

## 2021-11-14 DIAGNOSIS — Z3480 Encounter for supervision of other normal pregnancy, unspecified trimester: Secondary | ICD-10-CM

## 2021-11-14 DIAGNOSIS — Z713 Dietary counseling and surveillance: Secondary | ICD-10-CM | POA: Insufficient documentation

## 2021-11-14 MED ORDER — METFORMIN HCL 500 MG PO TABS
500.0000 mg | ORAL_TABLET | Freq: Every evening | ORAL | 5 refills | Status: DC
  Filled 2021-11-14: qty 30, 30d supply, fill #0
  Filled 2021-12-09: qty 30, 30d supply, fill #1

## 2021-11-14 NOTE — Progress Notes (Signed)
Interpreter services provided by [employer] #[id number]  Patient was seen on 11/14/2021 for follow-up assessment and education for Gestational Diabetes. EDD 02/09/2022; [redacted]w[redacted]d.  11:30 - 12:00  Pt reports her insurance did not cover the insulin prescribed at her last MD visit on 7/31.  Pt states since that visit she has made changes to diet and has increased exercise in the evening either before or after dinner; 60 min of walking or 30-45 min of cardio. Pt reports on evenings doesn't exercise FBS tends to be elevated.  From dietary recall sounds like patient is not restricting diet too much.    24 hr recall: B: today: pancakes (167) yesterday: egg, 1 ww bread, avocado, milk (107) L: yesterday: chicken, a little more than a cup of rice, salad (171); sample meals: chicken soup S: apple D: Tuna, cucumber, radish, onion, 1 slice whole wheat bread, 1 glass of milk (103)  The following learning objectives reviewed during follow-up visit:  Effect of exercise on FBS Alternate ways to add more flavor to pancakes and get more protein Increasing resistant starch in rice Medication  Plan:  Try letting rice cool before eating to see if it helps reduce BG On nights when you are not up to the amount of exercise you have been doing, try just 15 min of walking to see if that will keep FBS WNL Be sure to include protein with meals and try lower-sugar options if you feel you want some sweetness in your food If metformin is prescribed be sure to take it with a meal and also ask if the MD wants you to take in the morning or at night If you are prescribed vial insulin you will be scheduled another visit with me to learn how to use it.   Patient instructed to monitor glucose levels: FBS: 60 - 95 mg/dl 2 hour: <810 mg/dl  Patient received the following handouts: none  Patient will be seen for follow-up in 2 weeks or as needed.

## 2021-11-14 NOTE — Progress Notes (Signed)
   PRENATAL VISIT NOTE  Subjective:  Molly Carr is a 31 y.o. G3P2002 at [redacted]w[redacted]d being seen today for ongoing prenatal care.  She is currently monitored for the following issues for this high-risk pregnancy and has History of gestational diabetes; History of gestational hypertension; Supervision of other normal pregnancy, antepartum; Diabetes mellitus during pregnancy in second trimester; and Diet controlled gestational diabetes mellitus on their problem list.  Patient doing well with no acute concerns today. She reports no complaints.  Contractions: Irritability. Vag. Bleeding: None.  Movement: Present. Denies leaking of fluid.   The following portions of the patient's history were reviewed and updated as appropriate: allergies, current medications, past family history, past medical history, past social history, past surgical history and problem list. Problem list updated.  Objective:   Vitals:   11/14/21 1325  BP: 113/64  Pulse: 84  Weight: 181 lb 1.6 oz (82.1 kg)    Fetal Status:     Movement: Present     General:  Alert, oriented and cooperative. Patient is in no acute distress.  Skin: Skin is warm and dry. No rash noted.   Cardiovascular: Normal heart rate noted  Respiratory: Normal respiratory effort, no problems with respiration noted  Abdomen: Soft, gravid, appropriate for gestational age.  Pain/Pressure: Present     Pelvic: Cervical exam deferred        Extremities: Normal range of motion.  Edema: Trace  Mental Status:  Normal mood and affect. Normal behavior. Normal judgment and thought content.   Assessment and Plan:  Pregnancy: G3P2002 at [redacted]w[redacted]d  1. [redacted] weeks gestation of pregnancy   2. History of gestational hypertension Normal BP today, no s/sx  3. Supervision of other normal pregnancy, antepartum Continue routine prenatal care  - CBC - RPR - HIV Antibody (routine testing w rflx)  4. Insulin controlled gestational diabetes mellitus (GDM) in  second trimester Pt never picked up her insulin due to insurance issues. She states she took metformin in a previous pregnancy and would like to try that.  Review of chart showed that patient has not tried metformin this pregnancy and has not had excessively high blood sugars.  FBS: 90-103 PPBS: 103-167  7-8 out of range  5. Gestational diabetes mellitus (GDM) in second trimester controlled on oral hypoglycemic drug Pt will start with metformin in the evening after dinner.  If postprandial blood suagrs in the day time are elevated,after 2-3 days, pt advised to take the med BID  - metFORMIN (GLUCOPHAGE) 500 MG tablet; Take 1 tablet (500 mg total) by mouth every evening.  Dispense: 30 tablet; Refill: 5  Preterm labor symptoms and general obstetric precautions including but not limited to vaginal bleeding, contractions, leaking of fluid and fetal movement were reviewed in detail with the patient.  Please refer to After Visit Summary for other counseling recommendations.   Return in about 2 weeks (around 11/28/2021) for [facility], in person.   Mariel Aloe, MD Faculty Attending Center for [facility]

## 2021-11-14 NOTE — Patient Instructions (Signed)
Try letting rice cool before eating to see if it helps reduce BG On nights when you are not up to the amount of exercise you have been doing, try just 15 min of walking to see if that will keep FBS WNL Be sure to include protein with meals and try lower-sugar options if you feel you want some sweetness in your food If metformin is prescribed be sure to take it with a meal and also ask if the MD wants you to take in the morning or at night If you are prescribed vial insulin you will be scheduled another visit with me to learn how to use it.

## 2021-11-14 NOTE — Progress Notes (Signed)
Patient presents for ROB. Patient has no concerns today. 

## 2021-11-15 LAB — CBC
Hematocrit: 33.3 % — ABNORMAL LOW (ref 34.0–46.6)
Hemoglobin: 11.2 g/dL (ref 11.1–15.9)
MCH: 28.7 pg (ref 26.6–33.0)
MCHC: 33.6 g/dL (ref 31.5–35.7)
MCV: 85 fL (ref 79–97)
Platelets: 310 10*3/uL (ref 150–450)
RBC: 3.9 x10E6/uL (ref 3.77–5.28)
RDW: 13.5 % (ref 11.7–15.4)
WBC: 10.4 10*3/uL (ref 3.4–10.8)

## 2021-11-15 LAB — RPR: RPR Ser Ql: NONREACTIVE

## 2021-11-15 LAB — HIV ANTIBODY (ROUTINE TESTING W REFLEX): HIV Screen 4th Generation wRfx: NONREACTIVE

## 2021-11-21 ENCOUNTER — Encounter: Payer: Self-pay | Admitting: Obstetrics and Gynecology

## 2021-11-23 ENCOUNTER — Ambulatory Visit: Payer: Commercial Managed Care - HMO | Attending: Obstetrics and Gynecology

## 2021-11-23 ENCOUNTER — Ambulatory Visit: Payer: Commercial Managed Care - HMO | Admitting: *Deleted

## 2021-11-23 ENCOUNTER — Other Ambulatory Visit: Payer: Self-pay | Admitting: *Deleted

## 2021-11-23 VITALS — BP 130/68 | HR 93

## 2021-11-23 DIAGNOSIS — O24415 Gestational diabetes mellitus in pregnancy, controlled by oral hypoglycemic drugs: Secondary | ICD-10-CM | POA: Diagnosis present

## 2021-11-23 DIAGNOSIS — O99212 Obesity complicating pregnancy, second trimester: Secondary | ICD-10-CM | POA: Diagnosis present

## 2021-11-23 DIAGNOSIS — O09292 Supervision of pregnancy with other poor reproductive or obstetric history, second trimester: Secondary | ICD-10-CM | POA: Insufficient documentation

## 2021-11-23 DIAGNOSIS — O24419 Gestational diabetes mellitus in pregnancy, unspecified control: Secondary | ICD-10-CM

## 2021-11-23 DIAGNOSIS — O24410 Gestational diabetes mellitus in pregnancy, diet controlled: Secondary | ICD-10-CM | POA: Insufficient documentation

## 2021-11-28 ENCOUNTER — Encounter: Payer: Commercial Managed Care - HMO | Attending: Obstetrics and Gynecology | Admitting: Registered"

## 2021-11-28 ENCOUNTER — Ambulatory Visit (INDEPENDENT_AMBULATORY_CARE_PROVIDER_SITE_OTHER): Payer: Commercial Managed Care - HMO | Admitting: Registered"

## 2021-11-28 ENCOUNTER — Other Ambulatory Visit: Payer: Self-pay

## 2021-11-28 DIAGNOSIS — O24410 Gestational diabetes mellitus in pregnancy, diet controlled: Secondary | ICD-10-CM | POA: Insufficient documentation

## 2021-11-28 DIAGNOSIS — Z713 Dietary counseling and surveillance: Secondary | ICD-10-CM | POA: Insufficient documentation

## 2021-11-28 DIAGNOSIS — O24415 Gestational diabetes mellitus in pregnancy, controlled by oral hypoglycemic drugs: Secondary | ICD-10-CM

## 2021-11-28 NOTE — Progress Notes (Signed)
Interpreter services provided by [employer] B[place]ton Caves [phone]  Patient was seen on 11/28/2021 for follow-up assessment and education for Gestational Diabetes. EDD 02/09/2022; [redacted]w[redacted]d.  10:25 - 10:50  Metformin 500 mg with dinner  Pt states she was told to take 500 mg in morning if needed and has taken 2x in the last week, once after seeing a high number (132) after breakfast and once after an elevated FBS.  Pt states continues to make changes to diet and exercise.     Breakfast last couple of days:  Pancakes at [employer] 132 mg 2 whole wheat bread, avocado, milk 104 mg  The following learning objectives reviewed during follow-up visit:  Reviewed effect of exercise on FBS Alternate ways to add more flavor to pancakes and get more protein Medication  Patient instructed to monitor glucose levels: FBS: 60 - 95 mg/dl 2 hour: <122 mg/dl  Patient received the following handouts: none  Patient will be seen for follow-up in 2 weeks or as needed.

## 2021-11-29 ENCOUNTER — Ambulatory Visit (INDEPENDENT_AMBULATORY_CARE_PROVIDER_SITE_OTHER): Payer: Commercial Managed Care - HMO | Admitting: Obstetrics and Gynecology

## 2021-11-29 ENCOUNTER — Other Ambulatory Visit: Payer: Commercial Managed Care - HMO

## 2021-11-29 ENCOUNTER — Encounter: Payer: Self-pay | Admitting: Obstetrics and Gynecology

## 2021-11-29 VITALS — BP 114/75 | HR 93 | Wt 178.0 lb

## 2021-11-29 DIAGNOSIS — Z3482 Encounter for supervision of other normal pregnancy, second trimester: Secondary | ICD-10-CM

## 2021-11-29 DIAGNOSIS — O24410 Gestational diabetes mellitus in pregnancy, diet controlled: Secondary | ICD-10-CM

## 2021-11-29 DIAGNOSIS — Z3480 Encounter for supervision of other normal pregnancy, unspecified trimester: Secondary | ICD-10-CM

## 2021-11-29 DIAGNOSIS — Z789 Other specified health status: Secondary | ICD-10-CM

## 2021-11-29 DIAGNOSIS — Z3A29 29 weeks gestation of pregnancy: Secondary | ICD-10-CM

## 2021-11-29 DIAGNOSIS — Z8759 Personal history of other complications of pregnancy, childbirth and the puerperium: Secondary | ICD-10-CM

## 2021-11-29 NOTE — Progress Notes (Signed)
Pt states she has a "Bump" like area she would like to have checked.

## 2021-11-29 NOTE — Progress Notes (Signed)
Subjective:  Molly Carr is a 31 y.o. G3P2002 at [redacted]w[redacted]d being seen today for ongoing prenatal care.  She is currently monitored for the following issues for this high-risk pregnancy and has History of gestational diabetes; History of gestational hypertension; Supervision of other normal pregnancy, antepartum; Diabetes mellitus during pregnancy in second trimester; Diet controlled gestational diabetes mellitus; and Language barrier on their problem list.  Patient reports general discomforts of pregnancy.  Contractions: Not present. Vag. Bleeding: None.  Movement: Present. Denies leaking of fluid.   The following portions of the patient's history were reviewed and updated as appropriate: allergies, current medications, past family history, past medical history, past social history, past surgical history and problem list. Problem list updated.  Objective:   Vitals:   11/29/21 1001  BP: 114/75  Pulse: 93  Weight: 178 lb (80.7 kg)    Fetal Status: Fetal Heart Rate (bpm): 135   Movement: Present     General:  Alert, oriented and cooperative. Patient is in no acute distress.  Skin: Skin is warm and dry. No rash noted.   Cardiovascular: Normal heart rate noted  Respiratory: Normal respiratory effort, no problems with respiration noted  Abdomen: Soft, gravid, appropriate for gestational age. Pain/Pressure: Present     Pelvic:  small sebaceous cyst noted        Extremities: Normal range of motion.     Mental Status: Normal mood and affect. Normal behavior. Normal judgment and thought content.   Urinalysis:      Assessment and Plan:  Pregnancy: G3P2002 at [redacted]w[redacted]d  1. Supervision of other normal pregnancy, antepartum Stable Sebaceous cyst reviewed with pt. Potential removal with delivery if has epidural or at post partum visit in office under local  2. Diet controlled gestational diabetes mellitus (GDM) in second trimester CBG's controlled with Metformin Growth 80 %  11/23/21 Serial growth scans and antenatal testing as per protocol  3. History of gestational hypertension BP stable No S/Sx at present Continue with qd BASA  4. Language barrier Live interrupter used during today's visit  Preterm labor symptoms and general obstetric precautions including but not limited to vaginal bleeding, contractions, leaking of fluid and fetal movement were reviewed in detail with the patient. Please refer to After Visit Summary for other counseling recommendations.  Return in about 2 weeks (around 12/13/2021) for OB visit, face to face, MD only.   Hermina Staggers, MD

## 2021-11-29 NOTE — Addendum Note (Signed)
Addended by: Marya Landry D on: 11/29/2021 11:33 AM   Modules accepted: Orders

## 2021-11-29 NOTE — Patient Instructions (Signed)
Tercer trimestre de embarazo Third Trimester of Pregnancy  El tercer trimestre de embarazo va desde la semana 28 hasta la semana 40. Tambin se dice que va desde el mes 7 hasta el mes 9. En este trimestre, el beb en gestacin (feto) crece muy rpidamente. Hacia el final del noveno mes, el beb en gestacin mide alrededor de 20 pulgadas (45 cm) de largo. Pesa entre 6 y 10 libras (2,70 y 4,50 kg). Cambios en el cuerpo durante el tercer trimestre Su organismo contina atravesando por muchos cambios durante este perodo. Los cambios varan y generalmente vuelven a la normalidad despus del nacimiento del beb. Cambios fsicos Seguir aumentando de peso. Puede ser que aumente entre 25 y 35 libras (11 y 16 kg) hacia el final del embarazo. Si tiene bajo peso, puede aumentar entre 28 y 40 lb (unos 13 a 18 kg). Si tiene sobrepeso, puede aumentar entre 15 y 25 libras (unos 7 a 11 kg). Podrn aparecer las primeras estras en las caderas, el vientre (abdomen) y las mamas. Las mamas seguirn creciendo y pueden doler. Un lquido amarillo (calostro) puede salir de sus pechos. Esta es la primera leche que usted produce para el beb. Tal vez haya cambios en el cabello. El ombligo puede salir hacia afuera. Puede observar que se le hinchan ms las manos, la cara o los tobillos. Cambios en la salud Es posible que tenga acidez estomacal. Es posible que tenga dificultades para defecar (estreimiento). Pueden aparecerle hemorroides. Estas son venas hinchadas en el ano que pueden picar o doler. Puede comenzar a tener venas hinchadas (vrices) en las piernas. Puede presentar ms dolor en la pelvis, la espalda o los muslos. Puede presentar ms hormigueo o entumecimiento en las manos, los brazos y las piernas. La piel de su vientre tambin puede sentirse entumecida. Es posible que sienta falta de aire a medida que el tero se agranda. Otros cambios Es posible que haga pis (orine) con mayor frecuencia. Puede tener ms  problemas para dormir. Puede notar que el beb en gestacin "baja" o se mueve ms hacia bajo, en el vientre. Puede notar ms secrecin proveniente de la vagina. Puede sentir las articulaciones flojas y puede sentir dolor alrededor del hueso plvico. Siga estas instrucciones en su casa: Medicamentos Use los medicamentos de venta libre y los recetados solamente como se lo haya indicado el mdico. Algunos medicamentos no son seguros durante el embarazo. Tome vitaminas prenatales que contengan por lo menos 600 microgramos (mcg) de cido flico. Comida y bebida Consuma comidas saludables que incluyan lo siguiente: Frutas y verduras frescas. Cereales integrales. Buenas fuentes de protenas, como carne, huevos y tofu. Productos lcteos con bajo contenido de grasa. Evite la carne cruda y el jugo, la leche y el queso sin pasteurizar. Estos portan grmenes que pueden provocar dao tanto a usted como al beb. Tome 4 o 5 comidas pequeas en lugar de 3 comidas abundantes al da. Es posible que deba tomar medidas para prevenir o tratar los problemas para defecar: Beber suficiente lquido para mantener el pis (orina) de color amarillo plido. Come alimentos ricos en fibra. Entre ellos, frijoles, cereales integrales y frutas y verduras frescas. Limitar los alimentos con alto contenido de grasa y azcar. Estos incluyen alimentos fritos o dulces. Actividad Haga ejercicios solamente como se lo haya indicado el mdico. Interrumpa la actividad fsica si comienza a tener clicos en el tero. Evite levantar pesos excesivos. No haga ejercicio si hace demasiado calor, hay demasiada humedad o se encuentra en un lugar de mucha   altura (altitud elevada). Si lo desea, puede continuar teniendo relaciones sexuales, a menos que el mdico le indique lo contrario. Alivio del dolor y del malestar Haga pausas con frecuencia y descanse con las piernas levantadas (elevadas) si tiene calambres en las piernas o dolor en la parte  baja de la espalda. Dese baos de asiento con agua tibia para aliviar el dolor o las molestias causadas por las hemorroides. Use una crema para las hemorroides si el mdico la autoriza. Use un sostn que le brinde buen soporte si sus mamas estn sensibles. Si desarrolla venas hinchadas y abultadas en las piernas: Use medias de compresin segn las indicaciones de su mdico. Levante los pies durante 15 minutos, 3 o 4 veces por da. Limite la sal en sus alimentos. Seguridad Hable con el mdico antes de recorrer largas distancias. No se d baos de inmersin en agua caliente, baos turcos ni saunas. Use el cinturn de seguridad en todo momento mientras vaya en auto. Hable con el mdico si alguien le est haciendo dao o gritando mucho. Preparacin para la llegada del beb Para prepararse para la llegada de su beb: Tome clases prenatales. Visite el hospital y recorra el rea de maternidad. Compre un asiento de seguridad orientado hacia atrs para llevar al beb en el automvil. Aprenda cmo instalarlo en el auto. Prepare la habitacin del beb. Saque todas las almohadas y los animales de peluche de la cuna del beb. Instrucciones generales Evite el contacto con las bandejas sanitarias de los gatos y la tierra que estos animales usan. Estos contienen grmenes que pueden daar al beb y causar la prdida del beb ya sea aborto espontneo o muerte fetal. No se haga duchas vaginales ni use tampones. No use tampones ni toallas higinicas perfumadas. No fume ni consuma ningn producto que contenga nicotina o tabaco. Si necesita ayuda para dejar de fumar, consulte al mdico. No beba alcohol. No use medicamentos a base de hierbas, drogas ilegales, ni medicamentos que el mdico no haya autorizado. Las sustancias qumicas de estos productos pueden afectar al beb. Cumpla con todas las visitas de seguimiento. Esto es importante. Dnde buscar ms informacin American Pregnancy Association (Asociacin  Americana del Embarazo): americanpregnancy.org American College of Obstetricians and Gynecologists (Colegio Estadounidense de Obstetras y Gineclogos): www.acog.org Office on Women's Health (Oficina para la Salud de la Mujer): womenshealth.gov/pregnancy Comunquese con un mdico si: Tiene fiebre. Tiene clicos leves o siente presin en la parte baja del vientre. Sufre un dolor persistente en el abdomen. Vomita o hace deposiciones acuosas (diarrea). Advierte lquido con mal olor que proviene de la vagina. Siente dolor al orinar o hace orina con mal olor. Tiene un dolor de cabeza que no desaparece despus de tomar analgsicos. Nota cambios en la visin o ve manchas delante de los ojos. Solicite ayuda de inmediato si: Rompe la bolsa. Tiene contracciones regulares separadas por menos de 5 minutos. Tiene sangrado o pequeas prdidas vaginales. Tiene clicos o dolor muy intensos en el vientre. Tiene dificultad para respirar. Sientes dolor en el pecho. Se desmaya. No ha sentido al beb moverse durante el tiempo que le indic el mdico. Tiene dolor, hinchazn o enrojecimiento nuevos en un brazo o una pierna o se produce un aumento de alguno de estos sntomas. Resumen El tercer trimestre comprende desde la semana 28 hasta la semana 40 (desde el mes 7 hasta el mes 9). Esta es la poca en que el beb en gestacin crece muy rpidamente. Durante este perodo, las molestias pueden aumentar a medida que usted   sube de peso y el beb crece. Preprese para la llegada del beb: asista a las clases prenatales, compre un asiento de seguridad orientado hacia atrs para llevar al beb en auto y prepare la habitacin del beb. Solicite ayuda de inmediato si tiene sangrado por la vagina, siente dolor en el pecho y tiene dificultad para respirar, o si no ha sentido al beb moverse durante el tiempo que le indic el mdico. Esta informacin no tiene como fin reemplazar el consejo del mdico. Asegrese de hacerle al  mdico cualquier pregunta que tenga. Document Revised: 09/30/2019 Document Reviewed: 09/30/2019 Elsevier Patient Education  2023 Elsevier Inc.  

## 2021-11-30 LAB — GLUCOSE TOLERANCE, 2 HOURS W/ 1HR
Glucose, 1 hour: 202 mg/dL — ABNORMAL HIGH (ref 70–179)
Glucose, 2 hour: 148 mg/dL (ref 70–152)
Glucose, Fasting: 97 mg/dL — ABNORMAL HIGH (ref 70–91)

## 2021-12-07 ENCOUNTER — Encounter: Payer: Commercial Managed Care - HMO | Admitting: Obstetrics & Gynecology

## 2021-12-11 ENCOUNTER — Other Ambulatory Visit: Payer: Self-pay

## 2021-12-12 ENCOUNTER — Ambulatory Visit (INDEPENDENT_AMBULATORY_CARE_PROVIDER_SITE_OTHER): Payer: Commercial Managed Care - HMO | Admitting: Registered"

## 2021-12-12 ENCOUNTER — Other Ambulatory Visit: Payer: Self-pay

## 2021-12-12 ENCOUNTER — Encounter: Payer: Commercial Managed Care - HMO | Attending: Obstetrics | Admitting: Registered"

## 2021-12-12 ENCOUNTER — Other Ambulatory Visit: Payer: Self-pay | Admitting: Family Medicine

## 2021-12-12 DIAGNOSIS — O24415 Gestational diabetes mellitus in pregnancy, controlled by oral hypoglycemic drugs: Secondary | ICD-10-CM

## 2021-12-12 DIAGNOSIS — O24410 Gestational diabetes mellitus in pregnancy, diet controlled: Secondary | ICD-10-CM | POA: Insufficient documentation

## 2021-12-12 DIAGNOSIS — Z3A00 Weeks of gestation of pregnancy not specified: Secondary | ICD-10-CM | POA: Diagnosis not present

## 2021-12-12 MED ORDER — METFORMIN HCL 1000 MG PO TABS: 1000.0000 mg | ORAL_TABLET | Freq: Two times a day (BID) | ORAL | 5 refills | Status: DC

## 2021-12-12 NOTE — Progress Notes (Signed)
Dose change of metformin to 1g BID based on mildly uncontrolled sugars from visit with DM educator today

## 2021-12-12 NOTE — Progress Notes (Signed)
Interpreter services provided by [employer] Elita Quick [phone]  Patient was seen on 12/12/2021 for follow-up assessment and education for Gestational Diabetes. EDD 02/09/2022; [redacted]w[redacted]d.  3299 - 0920  Current medication Metformin 500 mg bid  Pt states she increased metformin to 500 mg twice a day instead of once a day due to elevated blood sugar.  Pt states she has started waking up very hungry in the night and having a piece of fruit or a sandwich, usually around 1 am. Pt checks FBS ~6:30 am.   Pt states she is exercising 4-5x/week in the afternoons either before or after dinner. Pt states sometimes she checks dinner PPBG before 2 hours so she can go to bed.     The following learning objectives reviewed during follow-up visit: Normal blood sugar levels at 1 hour (140 mg/dL) Appropriate midnight snacks Use of notes section on glucose log sheet  [facility] reviewed log sheet with Dr Crissie Reese and metformin was increased to 1000 mg/bid  Patient instructed to monitor glucose levels: FBS: 60 - 95 mg/dl 2 hour: <242 mg/dl  Patient received the following handouts: green carb counting card (Spanish)  Patient will be seen for follow-up in 2 weeks or as needed.

## 2021-12-13 ENCOUNTER — Encounter: Payer: Self-pay | Admitting: Obstetrics and Gynecology

## 2021-12-14 ENCOUNTER — Ambulatory Visit: Payer: Commercial Managed Care - HMO

## 2021-12-14 ENCOUNTER — Encounter: Payer: Commercial Managed Care - HMO | Admitting: Obstetrics & Gynecology

## 2021-12-15 ENCOUNTER — Ambulatory Visit (INDEPENDENT_AMBULATORY_CARE_PROVIDER_SITE_OTHER): Payer: Commercial Managed Care - HMO | Admitting: Obstetrics and Gynecology

## 2021-12-15 VITALS — BP 110/72 | HR 85 | Wt 183.0 lb

## 2021-12-15 DIAGNOSIS — Z789 Other specified health status: Secondary | ICD-10-CM

## 2021-12-15 DIAGNOSIS — Z8759 Personal history of other complications of pregnancy, childbirth and the puerperium: Secondary | ICD-10-CM

## 2021-12-15 DIAGNOSIS — Z3A32 32 weeks gestation of pregnancy: Secondary | ICD-10-CM

## 2021-12-15 DIAGNOSIS — O24415 Gestational diabetes mellitus in pregnancy, controlled by oral hypoglycemic drugs: Secondary | ICD-10-CM | POA: Diagnosis not present

## 2021-12-15 DIAGNOSIS — Z3480 Encounter for supervision of other normal pregnancy, unspecified trimester: Secondary | ICD-10-CM

## 2021-12-15 NOTE — Progress Notes (Signed)
Pt states she is having some ctx- painful at times. Pt states blood sugars are doing well - some elevated readings.

## 2021-12-15 NOTE — Progress Notes (Signed)
   PRENATAL VISIT NOTE  Subjective:  Molly Carr is a 31 y.o. G3P2002 at [redacted]w[redacted]d being seen today for ongoing prenatal care.  She is currently monitored for the following issues for this high-risk pregnancy and has History of gestational diabetes; History of gestational hypertension; Supervision of other normal pregnancy, antepartum; Diabetes mellitus during pregnancy in second trimester; Gestational diabetes mellitus (GDM) in third trimester controlled on oral hypoglycemic drug; and Language barrier on their problem list.  Patient doing well with no acute concerns today. She reports no complaints.  Contractions: Irregular. Vag. Bleeding: None.  Movement: Present. Denies leaking of fluid.   The following portions of the patient's history were reviewed and updated as appropriate: allergies, current medications, past family history, past medical history, past social history, past surgical history and problem list. Problem list updated.  Objective:   Vitals:   12/15/21 0933  BP: 110/72  Pulse: 85  Weight: 183 lb (83 kg)    Fetal Status: Fetal Heart Rate (bpm): nst Fundal Height: 32 cm Movement: Present     General:  Alert, oriented and cooperative. Patient is in no acute distress.  Skin: Skin is warm and dry. No rash noted.   Cardiovascular: Normal heart rate noted  Respiratory: Normal respiratory effort, no problems with respiration noted  Abdomen: Soft, gravid, appropriate for gestational age.  Pain/Pressure: Present     Pelvic: Cervical exam deferred        Extremities: Normal range of motion.     Mental Status:  Normal mood and affect. Normal behavior. Normal judgment and thought content.  NST: baseline 135, positive accels, moderate variability, one small variable decel noted, category 1, reactive Assessment and Plan:  Pregnancy: G3P2002 at [redacted]w[redacted]d  1. [redacted] weeks gestation of pregnancy   2. History of gestational hypertension BP WNL  3. Supervision of other  normal pregnancy, antepartum Continue routine prenatal care  4. Language barrier Live interpreter present  5. Gestational diabetes mellitus (GDM) in third trimester controlled on oral hypoglycemic drug FBS: 93-123, over half out of range PPBS: 99-140  Pt saw diabetic counselor and it was suggested that metformin increase to 1000 mg po BID, MD is in agreement  - Fetal nonstress test; Future  Preterm labor symptoms and general obstetric precautions including but not limited to vaginal bleeding, contractions, leaking of fluid and fetal movement were reviewed in detail with the patient.  Please refer to After Visit Summary for other counseling recommendations.   Return in about 2 weeks (around 12/29/2021) for [facility], in person.   Mariel Aloe, MD Faculty Attending Center for [facility]

## 2021-12-20 ENCOUNTER — Encounter: Payer: Commercial Managed Care - HMO | Admitting: Obstetrics and Gynecology

## 2021-12-22 ENCOUNTER — Ambulatory Visit: Payer: Commercial Managed Care - HMO | Attending: Obstetrics and Gynecology

## 2021-12-22 ENCOUNTER — Other Ambulatory Visit: Payer: Self-pay | Admitting: *Deleted

## 2021-12-22 ENCOUNTER — Ambulatory Visit: Payer: Commercial Managed Care - HMO | Admitting: *Deleted

## 2021-12-22 VITALS — BP 120/63 | HR 87

## 2021-12-22 DIAGNOSIS — O09293 Supervision of pregnancy with other poor reproductive or obstetric history, third trimester: Secondary | ICD-10-CM

## 2021-12-22 DIAGNOSIS — O99213 Obesity complicating pregnancy, third trimester: Secondary | ICD-10-CM | POA: Diagnosis not present

## 2021-12-22 DIAGNOSIS — O24415 Gestational diabetes mellitus in pregnancy, controlled by oral hypoglycemic drugs: Secondary | ICD-10-CM | POA: Insufficient documentation

## 2021-12-22 DIAGNOSIS — O24419 Gestational diabetes mellitus in pregnancy, unspecified control: Secondary | ICD-10-CM | POA: Diagnosis not present

## 2021-12-22 DIAGNOSIS — Z3A33 33 weeks gestation of pregnancy: Secondary | ICD-10-CM

## 2021-12-22 DIAGNOSIS — E669 Obesity, unspecified: Secondary | ICD-10-CM

## 2021-12-26 ENCOUNTER — Ambulatory Visit (INDEPENDENT_AMBULATORY_CARE_PROVIDER_SITE_OTHER): Payer: Commercial Managed Care - HMO | Admitting: Registered"

## 2021-12-26 ENCOUNTER — Encounter: Payer: Commercial Managed Care - HMO | Attending: Obstetrics | Admitting: Registered"

## 2021-12-26 ENCOUNTER — Other Ambulatory Visit: Payer: Self-pay

## 2021-12-26 DIAGNOSIS — O24410 Gestational diabetes mellitus in pregnancy, diet controlled: Secondary | ICD-10-CM | POA: Diagnosis present

## 2021-12-26 DIAGNOSIS — Z3A00 Weeks of gestation of pregnancy not specified: Secondary | ICD-10-CM | POA: Insufficient documentation

## 2021-12-26 DIAGNOSIS — O24415 Gestational diabetes mellitus in pregnancy, controlled by oral hypoglycemic drugs: Secondary | ICD-10-CM

## 2021-12-26 NOTE — Progress Notes (Signed)
[employer] video interpreter Elita Quick # [id number]  Start 1024    End 1052  Patient seen for Gestational Diabetes follow-up after increasing metformin from 500 mg bid to 1000 mg bid.   The increased dose of metformin is causing diarrhea and she will not take medication if she has to go out of the house.  Pt states 500 mg metformin did not cause diarrhea.  Pt states she gone to bed early the last few nights, not eating dinner and sleeping through the night. Patient is not eating snacks during the night as she was previously.    Plan: Due to diarrhea side effect of 1000 mg bid, reduce Metformin back down to 500 mg bid If needing snack in the middle of the night, reduce amount of carb (1/2 apple instead of whole apple) and have protein with it. Keep your MD vist the [place] location on 9/28 MD may discuss insulin with you at that time.  Return for follow-up if needing insulin instruction.

## 2021-12-28 ENCOUNTER — Ambulatory Visit (INDEPENDENT_AMBULATORY_CARE_PROVIDER_SITE_OTHER): Payer: Commercial Managed Care - HMO | Admitting: Obstetrics and Gynecology

## 2021-12-28 ENCOUNTER — Ambulatory Visit: Payer: Commercial Managed Care - HMO

## 2021-12-28 VITALS — BP 115/75 | HR 77 | Wt 187.0 lb

## 2021-12-28 DIAGNOSIS — O24415 Gestational diabetes mellitus in pregnancy, controlled by oral hypoglycemic drugs: Secondary | ICD-10-CM

## 2021-12-28 DIAGNOSIS — Z3A33 33 weeks gestation of pregnancy: Secondary | ICD-10-CM

## 2021-12-28 DIAGNOSIS — Z789 Other specified health status: Secondary | ICD-10-CM

## 2021-12-28 DIAGNOSIS — Z8759 Personal history of other complications of pregnancy, childbirth and the puerperium: Secondary | ICD-10-CM

## 2021-12-28 DIAGNOSIS — Z3480 Encounter for supervision of other normal pregnancy, unspecified trimester: Secondary | ICD-10-CM

## 2021-12-28 NOTE — Progress Notes (Signed)
   PRENATAL VISIT NOTE  Subjective:  Molly Carr is a 31 y.o. G3P2002 at [redacted]w[redacted]d being seen today for ongoing prenatal care.  She is currently monitored for the following issues for this high-risk pregnancy and has History of gestational diabetes; History of gestational hypertension; Supervision of other normal pregnancy, antepartum; Diabetes mellitus during pregnancy in second trimester; Gestational diabetes mellitus (GDM) in third trimester controlled on oral hypoglycemic drug; and Language barrier on their problem list.  Patient doing well with no acute concerns today. She reports occasional contractions.  Contractions: Irregular. Vag. Bleeding: None.  Movement: Present. Denies leaking of fluid.    Pt had decreased metformin from 1000 mg BID back to 500 mg BID due to increased GI disturbance and diarrhea.  Pt also noted changing her diet as well.  The following portions of the patient's history were reviewed and updated as appropriate: allergies, current medications, past family history, past medical history, past social history, past surgical history and problem list. Problem list updated.  Objective:   Vitals:   12/28/21 1009  BP: 115/75  Pulse: 77  Weight: 187 lb (84.8 kg)    Fetal Status: Fetal Heart Rate (bpm): 150 (Simultaneous filing. User may not have seen previous data.)   Movement: Present     General:  Alert, oriented and cooperative. Patient is in no acute distress.  Skin: Skin is warm and dry. No rash noted.   Cardiovascular: Normal heart rate noted  Respiratory: Normal respiratory effort, no problems with respiration noted  Abdomen: Soft, gravid, appropriate for gestational age.  Pain/Pressure: Present     Pelvic: Cervical exam deferred        Extremities: Normal range of motion.     Mental Status:  Normal mood and affect. Normal behavior. Normal judgment and thought content.   Assessment and Plan:  Pregnancy: G3P2002 at [redacted]w[redacted]d  1. [redacted] weeks  gestation of pregnancy   2. Gestational diabetes mellitus (GDM) in third trimester controlled on oral hypoglycemic drug FBS: in the 90's PPBS: majority under 120  Pt only had 3-4 days of data, will return in 1 week for further evaluation before converting to insulin possibly  Weekly NST/BPP  3. History of gestational hypertension BP WNL  4. Supervision of other normal pregnancy, antepartum Continue routine prenatal care  5. Language barrier Tele interpreter utilized  Preterm labor symptoms and general obstetric precautions including but not limited to vaginal bleeding, contractions, leaking of fluid and fetal movement were reviewed in detail with the patient.  Please refer to After Visit Summary for other counseling recommendations.   Return in about 1 week (around 01/04/2022) for [facility], in person. Re-evaluate blood sugars at that time  Lynnda Shields, MD Faculty Attending Center for [facility]

## 2021-12-29 ENCOUNTER — Ambulatory Visit: Payer: Commercial Managed Care - HMO | Admitting: *Deleted

## 2021-12-29 ENCOUNTER — Ambulatory Visit: Payer: Commercial Managed Care - HMO | Attending: Obstetrics and Gynecology

## 2021-12-29 ENCOUNTER — Other Ambulatory Visit: Payer: Self-pay | Admitting: Obstetrics and Gynecology

## 2021-12-29 VITALS — BP 126/72 | HR 105

## 2021-12-29 DIAGNOSIS — O09299 Supervision of pregnancy with other poor reproductive or obstetric history, unspecified trimester: Secondary | ICD-10-CM | POA: Diagnosis present

## 2021-12-29 DIAGNOSIS — Z8632 Personal history of gestational diabetes: Secondary | ICD-10-CM | POA: Diagnosis present

## 2021-12-29 DIAGNOSIS — O99213 Obesity complicating pregnancy, third trimester: Secondary | ICD-10-CM

## 2021-12-29 DIAGNOSIS — Z3A34 34 weeks gestation of pregnancy: Secondary | ICD-10-CM

## 2021-12-29 DIAGNOSIS — O24415 Gestational diabetes mellitus in pregnancy, controlled by oral hypoglycemic drugs: Secondary | ICD-10-CM

## 2021-12-29 DIAGNOSIS — E669 Obesity, unspecified: Secondary | ICD-10-CM | POA: Diagnosis not present

## 2021-12-29 DIAGNOSIS — O09293 Supervision of pregnancy with other poor reproductive or obstetric history, third trimester: Secondary | ICD-10-CM | POA: Diagnosis not present

## 2021-12-29 DIAGNOSIS — O24419 Gestational diabetes mellitus in pregnancy, unspecified control: Secondary | ICD-10-CM | POA: Insufficient documentation

## 2021-12-29 NOTE — Procedures (Signed)
Molly Carr Oct 21, 1990 [redacted]w[redacted]d  Fetus A Non-Stress Test Interpretation for 12/29/21  Indication: Gestational Diabetes medication controlled  Fetal Heart Rate A Mode: External Baseline Rate (A): 140 bpm Variability: Moderate Accelerations: 15 x 15 Decelerations: None Multiple birth?: No  Uterine Activity Mode: Palpation, Toco Contraction Frequency (min): 2 ucs with ui Contraction Duration (sec): 40-60 Contraction Quality: Mild Resting Tone Palpated: Relaxed Resting Time: Adequate  Interpretation (Fetal Testing) Nonstress Test Interpretation: Reactive Overall Impression: Reassuring for gestational age Comments: Dr. Epimenio Sarin reviewed tracing

## 2022-01-03 ENCOUNTER — Encounter: Payer: Self-pay | Admitting: Obstetrics and Gynecology

## 2022-01-03 ENCOUNTER — Encounter: Payer: Commercial Managed Care - HMO | Admitting: Obstetrics and Gynecology

## 2022-01-03 ENCOUNTER — Ambulatory Visit (INDEPENDENT_AMBULATORY_CARE_PROVIDER_SITE_OTHER): Payer: Commercial Managed Care - HMO | Admitting: Obstetrics and Gynecology

## 2022-01-03 VITALS — BP 114/70 | HR 83 | Wt 188.0 lb

## 2022-01-03 DIAGNOSIS — Z3A34 34 weeks gestation of pregnancy: Secondary | ICD-10-CM

## 2022-01-03 DIAGNOSIS — Z789 Other specified health status: Secondary | ICD-10-CM

## 2022-01-03 DIAGNOSIS — Z3483 Encounter for supervision of other normal pregnancy, third trimester: Secondary | ICD-10-CM

## 2022-01-03 DIAGNOSIS — O24419 Gestational diabetes mellitus in pregnancy, unspecified control: Secondary | ICD-10-CM

## 2022-01-03 DIAGNOSIS — Z758 Other problems related to medical facilities and other health care: Secondary | ICD-10-CM

## 2022-01-03 DIAGNOSIS — Z3480 Encounter for supervision of other normal pregnancy, unspecified trimester: Secondary | ICD-10-CM

## 2022-01-03 DIAGNOSIS — Z8759 Personal history of other complications of pregnancy, childbirth and the puerperium: Secondary | ICD-10-CM

## 2022-01-03 MED ORDER — METFORMIN HCL ER 500 MG PO TB24: 1000.0000 mg | ORAL_TABLET | Freq: Every day | ORAL | 1 refills | Status: DC

## 2022-01-03 NOTE — Progress Notes (Signed)
PRENATAL VISIT NOTE  Subjective:  Molly Carr is a 31 y.o. G3P2002 at [redacted]w[redacted]d being seen today for ongoing prenatal care.  She is currently monitored for the following issues for this high-risk pregnancy and has History of gestational hypertension; Supervision of other normal pregnancy, antepartum; Gestational diabetes mellitus (GDM) in third trimester controlled on oral hypoglycemic drug; and Language barrier on their problem list.  Patient reports occasional contractions.  Contractions: Irregular. Vag. Bleeding: None.  Movement: Present. Denies leaking of fluid.   The following portions of the patient's history were reviewed and updated as appropriate: allergies, current medications, past family history, past medical history, past social history, past surgical history and problem list.   Objective:   Vitals:   01/03/22 1455  BP: 114/70  Pulse: 83  Weight: 188 lb (85.3 kg)    Fetal Status: Fetal Heart Rate (bpm): 135   Movement: Present     General:  Alert, oriented and cooperative. Patient is in no acute distress.  Skin: Skin is warm and dry. No rash noted.   Cardiovascular: Normal heart rate noted  Respiratory: Normal respiratory effort, no problems with respiration noted  Abdomen: Soft, gravid, appropriate for gestational age.  Pain/Pressure: Present     Pelvic: Cervical exam performed in the presence of a chaperone Dilation: Fingertip Effacement (%): Thick Station: Ballotable  Extremities: Normal range of motion.     Mental Status: Normal mood and affect. Normal behavior. Normal judgment and thought content.   Assessment and Plan:  Pregnancy: G3P2002 at [redacted]w[redacted]d 1. [redacted] weeks gestation of pregnancy GBS next visit BREECH on 9/22 - Referral to Nutrition and Diabetes Services  2. GDM, class A2 Patient states she had to back down from metformin 1000 with breakfast and 1000 with dinner due to GI side effects and is currently on 500/500. AM fastings high  90s-100s and normal 2h breakfast and a little over a third of the 2h PP lunches are in the the 120s-130s and same with 2h PP dinner; her sugars were better controlled when she was on the 1000/1000. I told her that we will switch to metformin xr 1000mg  qday at supper and set her up for a DM education visit in about 1-2wks. If CBGs are still elevated, I told her that we can use the DM education visit to go over how to use insulin.  9/22: 2258g, 64%, ac 92; 9/29 breech, 10/10, afi 12; continue with weekly testing Fetal echo for tomorrow confirmed with patient Can set up 38wk IOL next visit - Referral to Nutrition and Diabetes Services  3. Language barrier In person interpreter used  4. History of gestational hypertension Continue low dose ASA  Preterm labor symptoms and general obstetric precautions including but not limited to vaginal bleeding, contractions, leaking of fluid and fetal movement were reviewed in detail with the patient. Please refer to After Visit Summary for other counseling recommendations.   Return in about 1 week (around 01/10/2022) for in person, high risk ob, md visit.  Future Appointments  Date Time Provider [place]  01/05/2022 10:45 AM [facility] NURSE [facility]  01/05/2022 11:00 AM [facility] US1 [facility]  01/11/2022 10:00 AM [facility] NURSE [facility] None  01/11/2022  2:15 PM [facility] NURSE [facility]  01/11/2022  2:30 PM [facility] US3 [facility]  01/18/2022 10:35 AM Laury Deep, CNM [facility] None  01/19/2022 10:30 AM [facility] NURSE [facility]  01/19/2022 10:45 AM [facility] US4 [facility]  01/25/2022 10:35 AM Griffin Basil,  MD [place] None  01/26/2022 10:30 AM [facility] NURSE [facility]  01/26/2022 10:45 AM [facility] US4 [facility]    Aletha Halim, MD

## 2022-01-05 ENCOUNTER — Ambulatory Visit: Payer: Commercial Managed Care - HMO | Admitting: *Deleted

## 2022-01-05 ENCOUNTER — Ambulatory Visit: Payer: Commercial Managed Care - HMO | Attending: Obstetrics and Gynecology

## 2022-01-05 ENCOUNTER — Encounter: Payer: Self-pay | Admitting: *Deleted

## 2022-01-05 VITALS — BP 120/59 | HR 82

## 2022-01-05 DIAGNOSIS — O24415 Gestational diabetes mellitus in pregnancy, controlled by oral hypoglycemic drugs: Secondary | ICD-10-CM | POA: Insufficient documentation

## 2022-01-05 DIAGNOSIS — O09293 Supervision of pregnancy with other poor reproductive or obstetric history, third trimester: Secondary | ICD-10-CM | POA: Diagnosis not present

## 2022-01-05 DIAGNOSIS — O99213 Obesity complicating pregnancy, third trimester: Secondary | ICD-10-CM | POA: Diagnosis not present

## 2022-01-05 DIAGNOSIS — Z362 Encounter for other antenatal screening follow-up: Secondary | ICD-10-CM | POA: Insufficient documentation

## 2022-01-05 DIAGNOSIS — O321XX0 Maternal care for breech presentation, not applicable or unspecified: Secondary | ICD-10-CM | POA: Diagnosis not present

## 2022-01-05 DIAGNOSIS — Z3A35 35 weeks gestation of pregnancy: Secondary | ICD-10-CM | POA: Diagnosis not present

## 2022-01-05 DIAGNOSIS — E669 Obesity, unspecified: Secondary | ICD-10-CM | POA: Diagnosis not present

## 2022-01-05 DIAGNOSIS — O24419 Gestational diabetes mellitus in pregnancy, unspecified control: Secondary | ICD-10-CM

## 2022-01-05 DIAGNOSIS — Z3480 Encounter for supervision of other normal pregnancy, unspecified trimester: Secondary | ICD-10-CM

## 2022-01-08 ENCOUNTER — Encounter: Payer: Self-pay | Admitting: Dietician

## 2022-01-08 ENCOUNTER — Encounter: Payer: Commercial Managed Care - HMO | Attending: Obstetrics and Gynecology | Admitting: Dietician

## 2022-01-08 DIAGNOSIS — O24415 Gestational diabetes mellitus in pregnancy, controlled by oral hypoglycemic drugs: Secondary | ICD-10-CM

## 2022-01-08 DIAGNOSIS — Z713 Dietary counseling and surveillance: Secondary | ICD-10-CM | POA: Insufficient documentation

## 2022-01-08 DIAGNOSIS — R7303 Prediabetes: Secondary | ICD-10-CM | POA: Insufficient documentation

## 2022-01-08 DIAGNOSIS — O99891 Other specified diseases and conditions complicating pregnancy: Secondary | ICD-10-CM | POA: Insufficient documentation

## 2022-01-08 DIAGNOSIS — Z3A35 35 weeks gestation of pregnancy: Secondary | ICD-10-CM | POA: Diagnosis not present

## 2022-01-08 NOTE — Progress Notes (Signed)
Patient is here today with her youngest daughter  and interpretor  Angie through [employer] ([place]).

## 2022-01-11 ENCOUNTER — Ambulatory Visit (INDEPENDENT_AMBULATORY_CARE_PROVIDER_SITE_OTHER): Payer: Commercial Managed Care - HMO | Admitting: *Deleted

## 2022-01-11 ENCOUNTER — Ambulatory Visit: Payer: Commercial Managed Care - HMO | Attending: Maternal & Fetal Medicine

## 2022-01-11 ENCOUNTER — Ambulatory Visit: Payer: Commercial Managed Care - HMO | Admitting: *Deleted

## 2022-01-11 VITALS — BP 117/79 | HR 89 | Wt 191.5 lb

## 2022-01-11 VITALS — BP 117/64 | HR 78

## 2022-01-11 DIAGNOSIS — O09293 Supervision of pregnancy with other poor reproductive or obstetric history, third trimester: Secondary | ICD-10-CM | POA: Diagnosis not present

## 2022-01-11 DIAGNOSIS — O99213 Obesity complicating pregnancy, third trimester: Secondary | ICD-10-CM | POA: Diagnosis not present

## 2022-01-11 DIAGNOSIS — E669 Obesity, unspecified: Secondary | ICD-10-CM | POA: Diagnosis not present

## 2022-01-11 DIAGNOSIS — O26893 Other specified pregnancy related conditions, third trimester: Secondary | ICD-10-CM

## 2022-01-11 DIAGNOSIS — Z3A35 35 weeks gestation of pregnancy: Secondary | ICD-10-CM

## 2022-01-11 DIAGNOSIS — O24415 Gestational diabetes mellitus in pregnancy, controlled by oral hypoglycemic drugs: Secondary | ICD-10-CM

## 2022-01-11 DIAGNOSIS — O1493 Unspecified pre-eclampsia, third trimester: Secondary | ICD-10-CM | POA: Diagnosis not present

## 2022-01-11 DIAGNOSIS — Z3480 Encounter for supervision of other normal pregnancy, unspecified trimester: Secondary | ICD-10-CM

## 2022-01-11 DIAGNOSIS — Z23 Encounter for immunization: Secondary | ICD-10-CM | POA: Diagnosis not present

## 2022-01-11 DIAGNOSIS — O321XX0 Maternal care for breech presentation, not applicable or unspecified: Secondary | ICD-10-CM | POA: Insufficient documentation

## 2022-01-11 DIAGNOSIS — O24419 Gestational diabetes mellitus in pregnancy, unspecified control: Secondary | ICD-10-CM | POA: Diagnosis present

## 2022-01-11 LAB — POCT URINALYSIS DIPSTICK
Appearance: NORMAL
Bilirubin, UA: NEGATIVE
Blood, UA: NEGATIVE
Glucose, UA: NEGATIVE
Leukocytes, UA: NEGATIVE
Nitrite, UA: NEGATIVE
Odor: NORMAL
Protein, UA: POSITIVE — AB
Spec Grav, UA: <=1.005 — AB (ref 1.010–1.025)
Urobilinogen, UA: 0.2 E.U./dL
pH, UA: 7.5 (ref 5.0–8.0)

## 2022-01-11 NOTE — Progress Notes (Signed)
Molly Carr presents for NST at 35.[redacted] wks GA. Scheduled for US/BPP with MFM this afternoon. No open slots for NST in their office per schedulers. Pt is doing well. Positive FM. Reports occ runs of braxton hicks UC's. Not sustained. No LOF or vaginal bleeding.   Reports burning after urination recently. Urine dip negative for leukocytes, nitrates, and blood. OB Urine Culture sent.   Requests TDAP vaccine previously offered. Counseled on reason for vaccine in pregnancy. Vaccine given.   NST is reactive. Signed off by Dr. Elly Modena. See OB Charting for documentation.

## 2022-01-13 LAB — URINE CULTURE

## 2022-01-18 ENCOUNTER — Ambulatory Visit (INDEPENDENT_AMBULATORY_CARE_PROVIDER_SITE_OTHER): Payer: Commercial Managed Care - HMO | Admitting: Obstetrics and Gynecology

## 2022-01-18 ENCOUNTER — Other Ambulatory Visit (HOSPITAL_COMMUNITY)
Admission: RE | Admit: 2022-01-18 | Discharge: 2022-01-18 | Disposition: A | Discharge status: Home / Self Care | Payer: Commercial Managed Care - HMO | Source: Ambulatory Visit | Attending: Obstetrics and Gynecology | Admitting: Obstetrics and Gynecology

## 2022-01-18 ENCOUNTER — Other Ambulatory Visit: Payer: Self-pay | Admitting: Obstetrics and Gynecology

## 2022-01-18 VITALS — BP 121/79 | HR 102 | Wt 189.6 lb

## 2022-01-18 DIAGNOSIS — Z3480 Encounter for supervision of other normal pregnancy, unspecified trimester: Secondary | ICD-10-CM | POA: Diagnosis present

## 2022-01-18 DIAGNOSIS — Z3A36 36 weeks gestation of pregnancy: Secondary | ICD-10-CM

## 2022-01-18 DIAGNOSIS — O24419 Gestational diabetes mellitus in pregnancy, unspecified control: Secondary | ICD-10-CM

## 2022-01-18 DIAGNOSIS — Z3483 Encounter for supervision of other normal pregnancy, third trimester: Secondary | ICD-10-CM

## 2022-01-18 DIAGNOSIS — O321XX0 Maternal care for breech presentation, not applicable or unspecified: Secondary | ICD-10-CM

## 2022-01-18 DIAGNOSIS — Z789 Other specified health status: Secondary | ICD-10-CM

## 2022-01-18 NOTE — Progress Notes (Signed)
Pt presents for ROB.  Reports ctx every 30 minutes that last 20 -30 seconds since this morning.   Next Ultrasound 10/20

## 2022-01-18 NOTE — Progress Notes (Signed)
  HIGH-RISK PREGNANCY OFFICE VISIT Patient name: Molly Carr MRN [id number]  Date of birth: [birth year] Chief Complaint:   Routine Prenatal Visit  History of Present Illness:   Molly Carr is a 31 y.o. [phone] female at [redacted]w[redacted]d with an Estimated Date of Delivery: 02/09/22 being seen today for ongoing management of a high-risk pregnancy complicated by A6TK currently on Metformin XR 1000 mg qd -- she states she was told to  now take 2; started taking 2 on 01/16/2022. Today she reports occasional contractions. Contractions: Irregular. Vag. Bleeding: None.  Movement: Present. denies leaking of fluid.  Review of Systems:   Pertinent items are noted in HPI Denies abnormal vaginal discharge w/ itching/odor/irritation, headaches, visual changes, shortness of breath, chest pain, abdominal pain, severe nausea/vomiting, or problems with urination or bowel movements unless otherwise stated above. Pertinent History Reviewed:  Reviewed past medical,surgical, social, obstetrical and family history.  Reviewed problem list, medications and allergies. Physical Assessment:   Vitals:   01/18/22 1056  BP: 121/79  Pulse: (!) 102  Weight: 189 lb 9.6 oz (86 kg)  Body mass index is 32.8 kg/m.           Physical Examination:   General appearance: alert, well appearing, and in no distress, oriented to person, place, and time, and overweight  Mental status: alert, oriented to person, place, and time, normal mood, behavior, speech, dress, motor activity, and thought processes  Skin: warm & dry   Extremities: Edema: Trace    Cardiovascular: normal heart rate noted  Respiratory: normal respiratory effort, no distress  Abdomen: gravid, soft, non-tender  Pelvic: Cervical exam performed  Dilation: 1 Effacement (%): 50 Station: Ballotable  Fetal Status: Fetal Heart Rate (bpm): 135 Fundal Height: 34 cm Movement: Present Presentation: Vertex  Fetal Surveillance Testing today: none    No results found for this or any previous visit (from the past 24 hour(s)).  Assessment & Plan:  1) High-risk pregnancy G3P2002 at [redacted]w[redacted]d with an Estimated Date of Delivery: 02/09/22   2) Supervision of other normal pregnancy, antepartum  - Culture, beta strep (group b only),  - Cervicovaginal ancillary only( [place])  3) GDM, class A2 - Review of Blood Sugar Levels: FBS range = 87-105 mg/dL (2/3 of values 95-105 range)  2 hr PP range = 107-145  4) Language barrier - Language Services In-Person Spanish Interpreter, [place] used for entire visit   5) Breech presentation, single or unspecified fetus - Consult with Dr. Dione Plover --> Dr. Dione Plover will call the patient and discuss her options: ECV, vaginal breech birth or cesarean delivery  6) [redacted] weeks gestation of pregnancy    Meds: No orders of the defined types were placed in this encounter.  Labs/procedures today: none  Treatment Plan:  U/S scheduled for tomorrow, TC by Dr. Dione Plover on 01/19/2022 to discuss her option  Reviewed: Preterm labor symptoms and general obstetric precautions including but not limited to vaginal bleeding, contractions, leaking of fluid and fetal movement were reviewed in detail with the patient.  All questions were answered.   Follow-up: Return in about 1 week (around 01/25/2022) for Return OB visit.  Orders Placed This Encounter  Procedures   Culture, beta strep (group b only)   Laury Deep MSN, CNM 01/18/2022 11:36 AM

## 2022-01-19 ENCOUNTER — Telehealth (HOSPITAL_COMMUNITY): Payer: Self-pay | Admitting: *Deleted

## 2022-01-19 ENCOUNTER — Ambulatory Visit (HOSPITAL_BASED_OUTPATIENT_CLINIC_OR_DEPARTMENT_OTHER): Payer: Commercial Managed Care - HMO

## 2022-01-19 ENCOUNTER — Telehealth: Payer: Self-pay | Admitting: Family Medicine

## 2022-01-19 ENCOUNTER — Other Ambulatory Visit: Payer: Self-pay | Admitting: Family Medicine

## 2022-01-19 ENCOUNTER — Ambulatory Visit: Payer: Commercial Managed Care - HMO | Admitting: *Deleted

## 2022-01-19 VITALS — BP 122/68 | HR 81

## 2022-01-19 DIAGNOSIS — O321XX0 Maternal care for breech presentation, not applicable or unspecified: Secondary | ICD-10-CM

## 2022-01-19 DIAGNOSIS — O24415 Gestational diabetes mellitus in pregnancy, controlled by oral hypoglycemic drugs: Secondary | ICD-10-CM

## 2022-01-19 DIAGNOSIS — E669 Obesity, unspecified: Secondary | ICD-10-CM

## 2022-01-19 DIAGNOSIS — O24419 Gestational diabetes mellitus in pregnancy, unspecified control: Secondary | ICD-10-CM | POA: Insufficient documentation

## 2022-01-19 DIAGNOSIS — Z3A37 37 weeks gestation of pregnancy: Secondary | ICD-10-CM

## 2022-01-19 DIAGNOSIS — O99213 Obesity complicating pregnancy, third trimester: Secondary | ICD-10-CM | POA: Diagnosis not present

## 2022-01-19 DIAGNOSIS — O09293 Supervision of pregnancy with other poor reproductive or obstetric history, third trimester: Secondary | ICD-10-CM | POA: Diagnosis not present

## 2022-01-19 LAB — CERVICOVAGINAL ANCILLARY ONLY
Chlamydia: NEGATIVE
Comment: NEGATIVE
Comment: NORMAL
Neisseria Gonorrhea: NEGATIVE

## 2022-01-19 NOTE — Progress Notes (Signed)
Admission orders 

## 2022-01-19 NOTE — Telephone Encounter (Signed)
Called patient and confirmed identity with two markers.   Discussed with Laury Deep yesterday, she had seen patient in clinic and noted she had been persistently breech. In addition she reviewed her sugar log and found she was mostly uncontrolled despite metformin therapy for GDM.   Patient had growth [place] today. EFW 24%, AFI 7 cm, BPP 8/8. Per MFM note delivery acceptable at any time after 37 weeks with uncontrolled sugars.   I spoke with patient by telephone and discussed breech presentation as well as management options. Reviewed the first would be ECV, ~1% risk of fetal intolerance leading to emergency cesarean. Second option would be planned vaginal breech delivery either outright or as a backup option to failed ECV. She is a good candidate for this given two prior vaginal deliveries and normal EFW on [place] today, in addition Dr. Kennon Rounds is on L&D tomorrow. Third option presented was scheduled primary cesarean.  Patient elected for ECV followed by planned breech delivery. I called L&D and scheduled the procedure. Dr. Kennon Rounds made aware. Patient advised to be NPO after midnight. Amy Harmon Pier advised as well to see to pre-op coordination.   Clarnce Flock, MD/MPH Attending Family Medicine Physician, [facility] for [facility], [place]  12:40 PM 01/19/22

## 2022-01-19 NOTE — Telephone Encounter (Signed)
[id number] INTERPRETER NUMBER  Preadmission screen

## 2022-01-20 ENCOUNTER — Encounter (HOSPITAL_COMMUNITY): Payer: Commercial Managed Care - HMO

## 2022-01-21 ENCOUNTER — Inpatient Hospital Stay (HOSPITAL_COMMUNITY): Payer: Commercial Managed Care - HMO | Admitting: Anesthesiology

## 2022-01-21 ENCOUNTER — Other Ambulatory Visit: Payer: Self-pay

## 2022-01-21 ENCOUNTER — Encounter (HOSPITAL_COMMUNITY)
Admission: RE | Disposition: A | Discharge status: Home / Self Care | Payer: Self-pay | Source: Home / Self Care | Attending: Obstetrics & Gynecology

## 2022-01-21 ENCOUNTER — Encounter (HOSPITAL_COMMUNITY): Payer: Self-pay | Admitting: Family Medicine

## 2022-01-21 ENCOUNTER — Inpatient Hospital Stay (HOSPITAL_COMMUNITY)
Admission: RE | Admit: 2022-01-21 | Discharge: 2022-01-23 | DRG: 788 | Disposition: A | Discharge status: Home / Self Care | Payer: Commercial Managed Care - HMO | Attending: Obstetrics & Gynecology | Admitting: Obstetrics & Gynecology

## 2022-01-21 ENCOUNTER — Inpatient Hospital Stay (HOSPITAL_COMMUNITY): Payer: Commercial Managed Care - HMO

## 2022-01-21 DIAGNOSIS — Z23 Encounter for immunization: Secondary | ICD-10-CM | POA: Diagnosis not present

## 2022-01-21 DIAGNOSIS — O24415 Gestational diabetes mellitus in pregnancy, controlled by oral hypoglycemic drugs: Secondary | ICD-10-CM | POA: Diagnosis present

## 2022-01-21 DIAGNOSIS — Z789 Other specified health status: Secondary | ICD-10-CM | POA: Diagnosis present

## 2022-01-21 DIAGNOSIS — O99824 Streptococcus B carrier state complicating childbirth: Secondary | ICD-10-CM | POA: Diagnosis present

## 2022-01-21 DIAGNOSIS — Z8759 Personal history of other complications of pregnancy, childbirth and the puerperium: Secondary | ICD-10-CM

## 2022-01-21 DIAGNOSIS — O321XX0 Maternal care for breech presentation, not applicable or unspecified: Secondary | ICD-10-CM

## 2022-01-21 DIAGNOSIS — O9902 Anemia complicating childbirth: Secondary | ICD-10-CM | POA: Diagnosis present

## 2022-01-21 DIAGNOSIS — O24425 Gestational diabetes mellitus in childbirth, controlled by oral hypoglycemic drugs: Secondary | ICD-10-CM | POA: Diagnosis present

## 2022-01-21 DIAGNOSIS — Z3A37 37 weeks gestation of pregnancy: Secondary | ICD-10-CM

## 2022-01-21 DIAGNOSIS — Z3480 Encounter for supervision of other normal pregnancy, unspecified trimester: Secondary | ICD-10-CM

## 2022-01-21 DIAGNOSIS — O99820 Streptococcus B carrier state complicating pregnancy: Secondary | ICD-10-CM

## 2022-01-21 DIAGNOSIS — Z975 Presence of (intrauterine) contraceptive device: Secondary | ICD-10-CM

## 2022-01-21 DIAGNOSIS — O322XX0 Maternal care for transverse and oblique lie, not applicable or unspecified: Secondary | ICD-10-CM | POA: Diagnosis present

## 2022-01-21 DIAGNOSIS — Z30014 Encounter for initial prescription of intrauterine contraceptive device: Secondary | ICD-10-CM | POA: Diagnosis not present

## 2022-01-21 DIAGNOSIS — O24424 Gestational diabetes mellitus in childbirth, insulin controlled: Secondary | ICD-10-CM

## 2022-01-21 HISTORY — PX: INTRAUTERINE DEVICE (IUD) INSERTION: SHX5877

## 2022-01-21 HISTORY — PX: EXTERNAL CEPHALIC VERSION: OBO2

## 2022-01-21 LAB — CBC
HCT: 34.7 % — ABNORMAL LOW (ref 36.0–46.0)
Hemoglobin: 11.7 g/dL — ABNORMAL LOW (ref 12.0–15.0)
MCH: 28.6 pg (ref 26.0–34.0)
MCHC: 33.7 g/dL (ref 30.0–36.0)
MCV: 84.8 fL (ref 80.0–100.0)
Platelets: 292 10*3/uL (ref 150–400)
RBC: 4.09 MIL/uL (ref 3.87–5.11)
RDW: 14 % (ref 11.5–15.5)
WBC: 8.5 10*3/uL (ref 4.0–10.5)
nRBC: 0 % (ref 0.0–0.2)

## 2022-01-21 LAB — CULTURE, BETA STREP (GROUP B ONLY): Strep Gp B Culture: POSITIVE — AB

## 2022-01-21 LAB — TYPE AND SCREEN
ABO/RH(D): O POS
Antibody Screen: NEGATIVE

## 2022-01-21 SURGERY — Surgical Case
Anesthesia: Epidural | Site: Cervix | Wound class: Clean Contaminated

## 2022-01-21 MED ORDER — HYDROMORPHONE HCL 1 MG/ML IJ SOLN
INTRAMUSCULAR | Status: AC
  Filled 2022-01-21: qty 1

## 2022-01-21 MED ORDER — MIDAZOLAM HCL 2 MG/2ML IJ SOLN
INTRAMUSCULAR | Status: AC
  Filled 2022-01-21: qty 2

## 2022-01-21 MED ORDER — HYDROMORPHONE HCL 1 MG/ML IJ SOLN
INTRAMUSCULAR | Status: AC
  Filled 2022-01-21: qty 0.5

## 2022-01-21 MED ORDER — EPHEDRINE 5 MG/ML INJ: 10.0000 mg | INTRAVENOUS | Status: DC | PRN

## 2022-01-21 MED ORDER — TRANEXAMIC ACID-NACL 1000-0.7 MG/100ML-% IV SOLN
INTRAVENOUS | Status: DC | PRN
  Administered 2022-01-21: 1000 mg via INTRAVENOUS

## 2022-01-21 MED ORDER — DIPHENHYDRAMINE HCL 25 MG PO CAPS: 25.0000 mg | ORAL_CAPSULE | ORAL | Status: DC | PRN

## 2022-01-21 MED ORDER — SODIUM CHLORIDE 0.9 % IV SOLN
500.0000 mg | INTRAVENOUS | Status: AC
  Administered 2022-01-21: 500 mg via INTRAVENOUS

## 2022-01-21 MED ORDER — MORPHINE SULFATE (PF) 0.5 MG/ML IJ SOLN
INTRAMUSCULAR | Status: DC | PRN
  Administered 2022-01-21: 3 mg via EPIDURAL

## 2022-01-21 MED ORDER — NALOXONE HCL 4 MG/10ML IJ SOLN: 1.0000 ug/kg/h | INTRAVENOUS | Status: DC | PRN

## 2022-01-21 MED ORDER — SIMETHICONE 80 MG PO CHEW: 80.0000 mg | CHEWABLE_TABLET | ORAL | Status: DC | PRN

## 2022-01-21 MED ORDER — ACETAMINOPHEN 325 MG PO TABS: 650.0000 mg | ORAL_TABLET | ORAL | Status: DC | PRN

## 2022-01-21 MED ORDER — DIBUCAINE (PERIANAL) 1 % EX OINT: 1.0000 | TOPICAL_OINTMENT | CUTANEOUS | Status: DC | PRN

## 2022-01-21 MED ORDER — FENTANYL CITRATE (PF) 100 MCG/2ML IJ SOLN
INTRAMUSCULAR | Status: DC | PRN
  Administered 2022-01-21 (×2): 50 ug via INTRAVENOUS

## 2022-01-21 MED ORDER — IBUPROFEN 600 MG PO TABS: 600.0000 mg | ORAL_TABLET | Freq: Four times a day (QID) | ORAL | Status: DC

## 2022-01-21 MED ORDER — MIDAZOLAM HCL 5 MG/5ML IJ SOLN
INTRAMUSCULAR | Status: DC | PRN
  Administered 2022-01-21: 2 mg via INTRAVENOUS

## 2022-01-21 MED ORDER — COCONUT OIL OIL: 1.0000 | TOPICAL_OIL | Status: DC | PRN

## 2022-01-21 MED ORDER — MENTHOL 3 MG MT LOZG: 1.0000 | LOZENGE | OROMUCOSAL | Status: DC | PRN

## 2022-01-21 MED ORDER — DEXAMETHASONE SODIUM PHOSPHATE 10 MG/ML IJ SOLN
INTRAMUSCULAR | Status: DC | PRN
  Administered 2022-01-21: 8 mg via INTRAVENOUS

## 2022-01-21 MED ORDER — PHENYLEPHRINE 80 MCG/ML (10ML) SYRINGE FOR IV PUSH (FOR BLOOD PRESSURE SUPPORT): 80.0000 ug | PREFILLED_SYRINGE | INTRAVENOUS | Status: DC | PRN

## 2022-01-21 MED ORDER — SOD CITRATE-CITRIC ACID 500-334 MG/5ML PO SOLN
30.0000 mL | ORAL | Status: DC | PRN
  Administered 2022-01-21: 30 mL via ORAL
  Filled 2022-01-21: qty 30

## 2022-01-21 MED ORDER — LIDOCAINE-EPINEPHRINE (PF) 2 %-1:200000 IJ SOLN
INTRAMUSCULAR | Status: AC
  Filled 2022-01-21: qty 20

## 2022-01-21 MED ORDER — PRENATAL MULTIVITAMIN CH
1.0000 | ORAL_TABLET | Freq: Every day | ORAL | Status: DC
  Administered 2022-01-22 – 2022-01-23 (×2): 1 via ORAL
  Filled 2022-01-21 (×2): qty 1

## 2022-01-21 MED ORDER — PARAGARD INTRAUTERINE COPPER IU IUD
1.0000 | INTRAUTERINE_SYSTEM | Freq: Once | INTRAUTERINE | Status: AC
  Administered 2022-01-21: 1 via INTRAUTERINE
  Filled 2022-01-21: qty 1

## 2022-01-21 MED ORDER — DIPHENHYDRAMINE HCL 50 MG/ML IJ SOLN: 12.5000 mg | INTRAMUSCULAR | Status: DC | PRN

## 2022-01-21 MED ORDER — DEXAMETHASONE SODIUM PHOSPHATE 10 MG/ML IJ SOLN
INTRAMUSCULAR | Status: AC
  Filled 2022-01-21: qty 1

## 2022-01-21 MED ORDER — OXYCODONE HCL 5 MG PO TABS: 5.0000 mg | ORAL_TABLET | ORAL | Status: DC | PRN

## 2022-01-21 MED ORDER — ONDANSETRON HCL 4 MG/2ML IJ SOLN
INTRAMUSCULAR | Status: DC | PRN
  Administered 2022-01-21: 4 mg via INTRAVENOUS

## 2022-01-21 MED ORDER — SENNOSIDES-DOCUSATE SODIUM 8.6-50 MG PO TABS
2.0000 | ORAL_TABLET | Freq: Every day | ORAL | Status: DC
  Administered 2022-01-22 – 2022-01-23 (×2): 2 via ORAL
  Filled 2022-01-21 (×2): qty 2

## 2022-01-21 MED ORDER — CEFAZOLIN SODIUM-DEXTROSE 2-3 GM-%(50ML) IV SOLR
INTRAVENOUS | Status: DC | PRN
  Administered 2022-01-21: 2 g via INTRAVENOUS

## 2022-01-21 MED ORDER — KETOROLAC TROMETHAMINE 30 MG/ML IJ SOLN
30.0000 mg | Freq: Once | INTRAMUSCULAR | Status: AC | PRN
  Administered 2022-01-21: 30 mg via INTRAVENOUS

## 2022-01-21 MED ORDER — TERBUTALINE SULFATE 1 MG/ML IJ SOLN: 0.2500 mg | Freq: Once | INTRAMUSCULAR | Status: AC

## 2022-01-21 MED ORDER — SCOPOLAMINE 1 MG/3DAYS TD PT72
MEDICATED_PATCH | TRANSDERMAL | Status: AC
  Filled 2022-01-21: qty 1

## 2022-01-21 MED ORDER — LACTATED RINGERS IV SOLN: INTRAVENOUS | Status: DC

## 2022-01-21 MED ORDER — KETOROLAC TROMETHAMINE 30 MG/ML IJ SOLN: 30.0000 mg | Freq: Four times a day (QID) | INTRAMUSCULAR | Status: AC | PRN

## 2022-01-21 MED ORDER — LACTATED RINGERS IV SOLN
500.0000 mL | INTRAVENOUS | Status: DC | PRN
  Administered 2022-01-21: 500 mL via INTRAVENOUS

## 2022-01-21 MED ORDER — TRANEXAMIC ACID-NACL 1000-0.7 MG/100ML-% IV SOLN: 1000.0000 mg | INTRAVENOUS | Status: DC

## 2022-01-21 MED ORDER — MISOPROSTOL 50MCG HALF TABLET
50.0000 ug | ORAL_TABLET | Freq: Once | ORAL | Status: AC
  Administered 2022-01-21: 50 ug via ORAL
  Filled 2022-01-21: qty 1

## 2022-01-21 MED ORDER — LIDOCAINE HCL (PF) 1 % IJ SOLN: 30.0000 mL | INTRAMUSCULAR | Status: DC | PRN

## 2022-01-21 MED ORDER — ONDANSETRON HCL 4 MG/2ML IJ SOLN: 4.0000 mg | Freq: Four times a day (QID) | INTRAMUSCULAR | Status: DC | PRN

## 2022-01-21 MED ORDER — HYDROMORPHONE HCL 1 MG/ML IJ SOLN
0.2500 mg | INTRAMUSCULAR | Status: DC | PRN
  Administered 2022-01-21 (×2): 0.5 mg via INTRAVENOUS

## 2022-01-21 MED ORDER — ONDANSETRON HCL 4 MG/2ML IJ SOLN: 4.0000 mg | Freq: Three times a day (TID) | INTRAMUSCULAR | Status: DC | PRN

## 2022-01-21 MED ORDER — PHENYLEPHRINE 80 MCG/ML (10ML) SYRINGE FOR IV PUSH (FOR BLOOD PRESSURE SUPPORT)
PREFILLED_SYRINGE | INTRAVENOUS | Status: AC
  Filled 2022-01-21: qty 10

## 2022-01-21 MED ORDER — SODIUM CHLORIDE 0.9% FLUSH: 3.0000 mL | INTRAVENOUS | Status: DC | PRN

## 2022-01-21 MED ORDER — ONDANSETRON HCL 4 MG/2ML IJ SOLN
INTRAMUSCULAR | Status: AC
  Filled 2022-01-21: qty 2

## 2022-01-21 MED ORDER — KETOROLAC TROMETHAMINE 30 MG/ML IJ SOLN: 30.0000 mg | Freq: Four times a day (QID) | INTRAMUSCULAR | Status: DC

## 2022-01-21 MED ORDER — MEASLES, MUMPS & RUBELLA VAC IJ SOLR: 0.5000 mL | Freq: Once | INTRAMUSCULAR | Status: DC

## 2022-01-21 MED ORDER — LIDOCAINE HCL (PF) 1 % IJ SOLN
INTRAMUSCULAR | Status: DC | PRN
  Administered 2022-01-21: 10 mL via EPIDURAL

## 2022-01-21 MED ORDER — OXYTOCIN-SODIUM CHLORIDE 30-0.9 UT/500ML-% IV SOLN
2.5000 [IU]/h | INTRAVENOUS | Status: DC
  Administered 2022-01-21: 15 [IU]/h via INTRAVENOUS

## 2022-01-21 MED ORDER — SOD CITRATE-CITRIC ACID 500-334 MG/5ML PO SOLN: 30.0000 mL | ORAL | Status: DC

## 2022-01-21 MED ORDER — PHENYLEPHRINE 80 MCG/ML (10ML) SYRINGE FOR IV PUSH (FOR BLOOD PRESSURE SUPPORT)
PREFILLED_SYRINGE | INTRAVENOUS | Status: DC | PRN
  Administered 2022-01-21: 80 ug via INTRAVENOUS

## 2022-01-21 MED ORDER — ACETAMINOPHEN 500 MG PO TABS
1000.0000 mg | ORAL_TABLET | Freq: Four times a day (QID) | ORAL | Status: AC
  Administered 2022-01-22 (×4): 1000 mg via ORAL
  Filled 2022-01-21 (×4): qty 2

## 2022-01-21 MED ORDER — FENTANYL CITRATE (PF) 100 MCG/2ML IJ SOLN: 50.0000 ug | INTRAMUSCULAR | Status: DC | PRN

## 2022-01-21 MED ORDER — OXYTOCIN-SODIUM CHLORIDE 30-0.9 UT/500ML-% IV SOLN: 2.5000 [IU]/h | INTRAVENOUS | Status: AC

## 2022-01-21 MED ORDER — TETANUS-DIPHTH-ACELL PERTUSSIS 5-2.5-18.5 LF-MCG/0.5 IM SUSY: 0.5000 mL | PREFILLED_SYRINGE | Freq: Once | INTRAMUSCULAR | Status: DC

## 2022-01-21 MED ORDER — PENICILLIN G POT IN DEXTROSE 60000 UNIT/ML IV SOLN
3.0000 10*6.[IU] | INTRAVENOUS | Status: DC
  Administered 2022-01-21: 3 10*6.[IU] via INTRAVENOUS
  Filled 2022-01-21 (×4): qty 50

## 2022-01-21 MED ORDER — ENOXAPARIN SODIUM 40 MG/0.4ML IJ SOSY
40.0000 mg | PREFILLED_SYRINGE | INTRAMUSCULAR | Status: DC
  Administered 2022-01-22: 40 mg via SUBCUTANEOUS
  Filled 2022-01-21: qty 0.4

## 2022-01-21 MED ORDER — TERBUTALINE SULFATE 1 MG/ML IJ SOLN
INTRAMUSCULAR | Status: AC
  Administered 2022-01-21: 0.25 mg via SUBCUTANEOUS
  Filled 2022-01-21: qty 1

## 2022-01-21 MED ORDER — MEPERIDINE HCL 25 MG/ML IJ SOLN: 6.2500 mg | INTRAMUSCULAR | Status: DC | PRN

## 2022-01-21 MED ORDER — MORPHINE SULFATE (PF) 0.5 MG/ML IJ SOLN
INTRAMUSCULAR | Status: AC
  Filled 2022-01-21: qty 10

## 2022-01-21 MED ORDER — DIPHENHYDRAMINE HCL 25 MG PO CAPS: 25.0000 mg | ORAL_CAPSULE | Freq: Four times a day (QID) | ORAL | Status: DC | PRN

## 2022-01-21 MED ORDER — SIMETHICONE 80 MG PO CHEW
80.0000 mg | CHEWABLE_TABLET | Freq: Three times a day (TID) | ORAL | Status: DC
  Administered 2022-01-22 – 2022-01-23 (×4): 80 mg via ORAL
  Filled 2022-01-21 (×4): qty 1

## 2022-01-21 MED ORDER — MISOPROSTOL 25 MCG QUARTER TABLET
25.0000 ug | ORAL_TABLET | Freq: Once | ORAL | Status: AC
  Administered 2022-01-21: 25 ug via VAGINAL
  Filled 2022-01-21: qty 1

## 2022-01-21 MED ORDER — DEXMEDETOMIDINE HCL IN NACL 200 MCG/50ML IV SOLN
INTRAVENOUS | Status: DC | PRN
  Administered 2022-01-21 (×5): 4 ug via INTRAVENOUS

## 2022-01-21 MED ORDER — KETOROLAC TROMETHAMINE 30 MG/ML IJ SOLN
INTRAMUSCULAR | Status: AC
  Filled 2022-01-21: qty 1

## 2022-01-21 MED ORDER — FENTANYL-BUPIVACAINE-NACL 0.5-0.125-0.9 MG/250ML-% EP SOLN
12.0000 mL/h | EPIDURAL | Status: DC | PRN
  Administered 2022-01-21: 12 mL/h via EPIDURAL
  Filled 2022-01-21: qty 250

## 2022-01-21 MED ORDER — SODIUM CHLORIDE 0.9 % IV SOLN
5.0000 10*6.[IU] | Freq: Once | INTRAVENOUS | Status: AC
  Administered 2022-01-21: 5 10*6.[IU] via INTRAVENOUS
  Filled 2022-01-21: qty 5

## 2022-01-21 MED ORDER — FENTANYL CITRATE (PF) 100 MCG/2ML IJ SOLN
INTRAMUSCULAR | Status: AC
  Filled 2022-01-21: qty 2

## 2022-01-21 MED ORDER — SODIUM BICARBONATE 8.4 % IV SOLN
INTRAVENOUS | Status: DC | PRN
  Administered 2022-01-21 (×2): 5 mL via EPIDURAL
  Administered 2022-01-21: 3 mL via EPIDURAL

## 2022-01-21 MED ORDER — SCOPOLAMINE 1 MG/3DAYS TD PT72
1.0000 | MEDICATED_PATCH | Freq: Once | TRANSDERMAL | Status: DC
  Administered 2022-01-21: 1.5 mg via TRANSDERMAL

## 2022-01-21 MED ORDER — OXYTOCIN BOLUS FROM INFUSION: 333.0000 mL | Freq: Once | INTRAVENOUS | Status: DC

## 2022-01-21 MED ORDER — LACTATED RINGERS IV SOLN
500.0000 mL | Freq: Once | INTRAVENOUS | Status: AC
  Administered 2022-01-21: 500 mL via INTRAVENOUS

## 2022-01-21 MED ORDER — CEFAZOLIN SODIUM-DEXTROSE 2-4 GM/100ML-% IV SOLN: 2.0000 g | INTRAVENOUS | Status: DC

## 2022-01-21 MED ORDER — NALOXONE HCL 0.4 MG/ML IJ SOLN: 0.4000 mg | INTRAMUSCULAR | Status: DC | PRN

## 2022-01-21 MED ORDER — WITCH HAZEL-GLYCERIN EX PADS: 1.0000 | MEDICATED_PAD | CUTANEOUS | Status: DC | PRN

## 2022-01-21 MED ORDER — PROMETHAZINE HCL 25 MG/ML IJ SOLN: 6.2500 mg | INTRAMUSCULAR | Status: DC | PRN

## 2022-01-21 SURGICAL SUPPLY — 37 items
BENZOIN TINCTURE PRP APPL 2/3 (GAUZE/BANDAGES/DRESSINGS) ×2 IMPLANT
CHLORAPREP W/TINT 26ML (MISCELLANEOUS) ×4 IMPLANT
CLAMP UMBILICAL CORD (MISCELLANEOUS) ×2 IMPLANT
CLOTH BEACON ORANGE TIMEOUT ST (SAFETY) ×2 IMPLANT
DERMABOND ADVANCED .7 DNX12 (GAUZE/BANDAGES/DRESSINGS) ×2 IMPLANT
DRSG OPSITE POSTOP 4X10 (GAUZE/BANDAGES/DRESSINGS) ×2 IMPLANT
ELECT REM PT RETURN 9FT ADLT (ELECTROSURGICAL) ×2
ELECTRODE REM PT RTRN 9FT ADLT (ELECTROSURGICAL) ×2 IMPLANT
EXTRACTOR VACUUM KIWI (MISCELLANEOUS) IMPLANT
GLOVE BIOGEL PI IND STRL 7.0 (GLOVE) ×6 IMPLANT
GLOVE ECLIPSE 6.5 STRL STRAW (GLOVE) ×2 IMPLANT
GOWN STRL REUS W/TWL LRG LVL3 (GOWN DISPOSABLE) ×6 IMPLANT
HEMOSTAT ARISTA ABSORB 3G PWDR (HEMOSTASIS) IMPLANT
KIT ABG SYR 3ML LUER SLIP (SYRINGE) IMPLANT
NDL HYPO 25X5/8 SAFETYGLIDE (NEEDLE) IMPLANT
NEEDLE HYPO 25X5/8 SAFETYGLIDE (NEEDLE) IMPLANT
NS IRRIG 1000ML POUR BTL (IV SOLUTION) ×2 IMPLANT
PACK C SECTION WH (CUSTOM PROCEDURE TRAY) ×2 IMPLANT
PAD ABD 7.5X8 STRL (GAUZE/BANDAGES/DRESSINGS) ×2 IMPLANT
PAD OB MATERNITY 4.3X12.25 (PERSONAL CARE ITEMS) ×2 IMPLANT
RTRCTR C-SECT PINK 25CM LRG (MISCELLANEOUS) ×2 IMPLANT
SUT PLAIN 0 NONE (SUTURE) IMPLANT
SUT PLAIN 2 0 (SUTURE) ×2
SUT PLAIN 2 0 XLH (SUTURE) IMPLANT
SUT PLAIN ABS 2-0 CT1 27XMFL (SUTURE) IMPLANT
SUT VIC AB 0 CT1 27 (SUTURE) ×4
SUT VIC AB 0 CT1 27XBRD ANBCTR (SUTURE) ×4 IMPLANT
SUT VIC AB 0 CTX 36 (SUTURE) ×6
SUT VIC AB 0 CTX36XBRD ANBCTRL (SUTURE) ×6 IMPLANT
SUT VIC AB 2-0 CT1 27 (SUTURE) ×2
SUT VIC AB 2-0 CT1 TAPERPNT 27 (SUTURE) ×2 IMPLANT
SUT VIC AB 3-0 SH 27 (SUTURE) ×2
SUT VIC AB 3-0 SH 27XBRD (SUTURE) ×2 IMPLANT
SUT VIC AB 4-0 KS 27 (SUTURE) ×2 IMPLANT
TOWEL OR 17X24 6PK STRL BLUE (TOWEL DISPOSABLE) ×2 IMPLANT
TRAY FOLEY W/BAG SLVR 14FR LF (SET/KITS/TRAYS/PACK) IMPLANT
WATER STERILE IRR 1000ML POUR (IV SOLUTION) ×2 IMPLANT

## 2022-01-21 NOTE — Discharge Summary (Signed)
Postpartum Discharge Summary  Date of Service updated***     Patient Name: Molly Carr DOB: 12-Feb-1991 MRN: [id number]  Date of admission: 01/21/2022 Delivery date:01/21/2022  Delivering provider: Janyth Pupa  Date of discharge: 01/21/2022  Admitting diagnosis: Breech presentation of fetus [O32.1XX0] Intrauterine pregnancy: [redacted]w[redacted]d    Secondary diagnosis:  Principal Problem:   Delivery by cesarean section for breech presentation Active Problems:   History of gestational hypertension   Supervision of other normal pregnancy, antepartum   Gestational diabetes mellitus (GDM) in third trimester controlled on oral hypoglycemic drug   Language barrier  Additional problems: ***    Discharge diagnosis: Term Pregnancy Delivered and GDM A2                                              Post partum procedures:{Postpartum procedures:23558} Augmentation: AROM, Cytotec, and IP Foley Complications: None  Hospital course: Induction of Labor With Cesarean Section   31y.o. yo G3P3003 at 340w2das admitted to the hospital 01/21/2022 for induction of labor. Patient had a labor course significant for Failed ECV, then IOL with cytotec and FB then AROM, but fetus became transverse breech. The patient went for cesarean section due to Malpresentation. Delivery details are as follows: Membrane Rupture Time/Date: 6:01 PM ,01/21/2022   Delivery Method:C-Section, Low Transverse  Details of operation can be found in separate operative Note.  Patient had a postpartum course complicated by***. She is ambulating, tolerating a regular diet, passing flatus, and urinating well.  Patient is discharged home in stable condition on 01/21/22.      Newborn Data: Birth date:01/21/2022  Birth time:9:01 PM  Gender:Female  Living status:Living  Apgars:5 ,9  Weight:                                Magnesium Sulfate received: {Mag received:30440022} BMZ received:  No Rhophylac:N/A MMR:N/A T-DaP:{Tdap:23962} Flu: No Transfusion:{Transfusion received:30440034}  Physical exam  Vitals:   01/21/22 2015 01/21/22 2020 01/21/22 2025 01/21/22 2030  BP: 118/61 136/75 139/73 (!) 143/83  Pulse: 78 91 90 (!) 101  Resp:      Temp:      TempSrc:      SpO2: 100% 100% 100% 100%  Weight:      Height:       General: {Exam; general:21111117} Lochia: {Desc; appropriate/inappropriate:30686::"appropriate"} Uterine Fundus: {Desc; firm/soft:30687} Incision: {Exam; incision:21111123} DVT Evaluation: {Exam; dvt:2111122} Labs: Lab Results  Component Value Date   WBC 8.5 01/21/2022   HGB 11.7 (L) 01/21/2022   HCT 34.7 (L) 01/21/2022   MCV 84.8 01/21/2022   PLT 292 01/21/2022      Latest Ref Rng & Units 06/24/2021    9:20 PM  CMP  Glucose 70 - 99 mg/dL 113   BUN 6 - 20 mg/dL 11   Creatinine 0.44 - 1.00 mg/dL 0.53   Sodium 135 - 145 mmol/L 135   Potassium 3.5 - 5.1 mmol/L 3.9   Chloride 98 - 111 mmol/L 104   CO2 22 - 32 mmol/L 23   Calcium 8.9 - 10.3 mg/dL 9.2   Total Protein 6.5 - 8.1 g/dL 7.2   Total Bilirubin 0.3 - 1.2 mg/dL 0.7   Alkaline Phos 38 - 126 U/L 63   AST 15 - 41 U/L 14   ALT 0 -  44 U/L 23    Edinburgh Score:    04/11/2020    8:49 AM  Edinburgh Postnatal Depression Scale Screening Tool  I have been able to laugh and see the funny side of things. 0  I have looked forward with enjoyment to things. 0  I have blamed myself unnecessarily when things went wrong. 0  I have been anxious or worried for no good reason. 0  I have felt scared or panicky for no good reason. 0  Things have been getting on top of me. 0  I have been so unhappy that I have had difficulty sleeping. 0  I have felt sad or miserable. 0  I have been so unhappy that I have been crying. 0  The thought of harming myself has occurred to me. 0  Edinburgh Postnatal Depression Scale Total 0     After visit meds:  Allergies as of 01/21/2022   No Known Allergies   Med  Rec must be completed prior to using this [facility]***        Discharge home in stable condition Infant Feeding: {Baby feeding:23562} Infant Disposition:{CHL IP OB HOME WITH DIYMEB:58309} Discharge instruction: per After Visit Summary and Postpartum booklet. Activity: Advance as tolerated. Pelvic rest for 6 weeks.  Diet: {OB MMHW:80881103} Future Appointments: Future Appointments  Date Time Provider [place]  01/25/2022 10:35 AM Griffin Basil, MD [facility] None  01/26/2022 10:30 AM [facility] NURSE [facility]  01/26/2022 10:45 AM [facility] US4 [facility] [place]   Follow up Visit: Message sent to [facility] 10/22  Please schedule this patient for a In person postpartum visit in 6 weeks with the following provider: Any provider. Additional Postpartum F/U:2 hour GTT and Incision check 1 week  High risk pregnancy complicated by: GDM Delivery mode:  C-Section, Low Transverse  Anticipated Birth Control:  PP IUD placed   01/21/2022 Shelda Pal, DO

## 2022-01-21 NOTE — Transfer of Care (Signed)
Immediate Anesthesia Transfer of Care Note  Patient: Molly Carr  Procedure(s) Performed: CESAREAN SECTION (Abdomen) INTRAUTERINE DEVICE (IUD) INSERTION (Cervix)  Patient Location: PACU  Anesthesia Type:Regional and Epidural  Level of Consciousness: awake, alert , oriented and patient cooperative  Airway & Oxygen Therapy: Patient Spontanous Breathing  Post-op Assessment: Report given to RN and Post -op Vital signs reviewed and stable  Post vital signs: Reviewed and stable  Last Vitals:  Vitals Value Taken Time  BP 114/84 01/21/22 2140  Temp 36.6 C 01/21/22 2140  Pulse 80 01/21/22 2150  Resp 15 01/21/22 2150  SpO2 98 % 01/21/22 2150  Vitals shown include unvalidated device data.  Last Pain:  Vitals:   01/21/22 1810  TempSrc: Oral  PainSc: 6          Complications: No notable events documented.

## 2022-01-21 NOTE — Procedures (Signed)
  ECV Procedure Note  Molly Carr is a 31 y.o. G3P2002  at [redacted]w[redacted]d being admitted for scheduled External cephalic version. The risks of the procedure were discussed with the patient including but were not limited to: failure to turn the baby to head down; pressure on uterus resulting in the water breaking or abruption of the placenta, either of which may result in an emergent cesarean section.   The patient concurred with the proposed plan, giving informed written consent for the procedure. Patient has been NPO since last night she will remain NPO for procedure. Anesthesia aware.   Patient was not given a epidural, spinal prior to attempting the procedure. NST was reactive prior to the procedure.  Patient was given 0.25 mg of SQ terbutaline prior to initiating the procedure. ECV was attempted 3 times. Procedure was not successful. Patient was transferred to L&D for IOL .  Shelda Pal, Brenda Fellow, Faculty practice [name] for [place] 01/21/22  7:38 PM

## 2022-01-21 NOTE — Progress Notes (Signed)
Labor Progress Note  Molly Carr is a 31 y.o. G3P2002 at [redacted]w[redacted]d presented for IOL GDMA2, Breech, unsuccessful EVC  S: Doing well  O:  BP 131/74   Pulse 96   Temp 98.1 F (36.7 C) (Oral)   Resp 17   Ht 5' 3.75" (1.619 m)   Wt 85.7 kg   LMP 04/29/2021 (Exact Date)   BMI 32.70 kg/m  EFM: 145 bpm/Moderate variability/ 15x15 accels/ None decels  CVE: Dilation: 2.5 Effacement (%): 50 Station: -3 Exam by:: Clement Husbands, DO   A&P: 52 y.o. [id number] [redacted]w[redacted]d  here for IOL as above  #Labor: Progressing well. Inserted FB @ 1410 and cytotec 50/11mcg PO/VAG #Pain: Family/Friend support and PO/IV pain meds #FWB: CAT 1 #GBS positive- PCN ongoing #GDMA2: cbg q4h, CTM  Shelda Pal, DO FMOB Fellow, Faculty practice [place] for [place] 01/21/22  2:14 PM

## 2022-01-21 NOTE — Anesthesia Postprocedure Evaluation (Signed)
Anesthesia Post Note  Patient: Molly Carr  Procedure(s) Performed: CESAREAN SECTION (Abdomen) INTRAUTERINE DEVICE (IUD) INSERTION (Cervix)     Patient location during evaluation: PACU Anesthesia Type: Epidural Level of consciousness: awake Pain management: pain level controlled Vital Signs Assessment: post-procedure vital signs reviewed and stable Respiratory status: spontaneous breathing Cardiovascular status: stable Postop Assessment: no headache, no backache, epidural receding, patient able to bend at knees and no apparent nausea or vomiting Anesthetic complications: no   No notable events documented.  Last Vitals:  Vitals:   01/21/22 2240 01/21/22 2255  BP:  113/84  Pulse: 86 80  Resp: 20 18  Temp:  36.6 C  SpO2: 97% 97%    Last Pain:  Vitals:   01/21/22 2255  TempSrc: Oral  PainSc:    Pain Goal:                   Huston Foley

## 2022-01-21 NOTE — Anesthesia Procedure Notes (Signed)
Epidural Patient location during procedure: OB Start time: 01/21/2022 7:48 PM End time: 01/21/2022 7:52 PM  Staffing Anesthesiologist: Lyn Hollingshead, MD  Preanesthetic Checklist Completed: patient identified, IV checked, site marked, risks and benefits discussed, surgical consent, monitors and equipment checked, pre-op evaluation and timeout performed  Epidural Patient position: sitting Prep: DuraPrep and site prepped and draped Patient monitoring: continuous pulse ox and blood pressure Approach: midline Location: L3-L4 Injection technique: LOR air  Needle:  Needle type: Tuohy  Needle gauge: 17 G Needle length: 9 cm and 9 Needle insertion depth: 5 cm cm Catheter type: closed end flexible Catheter size: 19 Gauge Catheter at skin depth: 10 cm Test dose: negative and Other  Assessment Events: blood not aspirated, injection not painful, no injection resistance, no paresthesia and negative IV test  Additional Notes Reason for block:procedure for pain

## 2022-01-21 NOTE — H&P (Signed)
Faculty Practice H&P  Molly Carr is a 31 y.o. female [phone] with IUP at [redacted]w[redacted]d presenting for ECV due to breech position. She will stay and be induced for uncontrolled A2 GDM. Last US shows EFW at 24%.  Pt states she has been having occasional contractions, no vaginal bleeding, intact membranes, with normal fetal movement.     Prenatal Course Source of Care: [facility] with onset of care at [facility]  Pregnancy complications or risks: Patient Active Problem List   Diagnosis Date Noted   Breech presentation of fetus 01/21/2022   Language barrier 11/29/2021   Gestational diabetes mellitus (GDM) in third trimester controlled on oral hypoglycemic drug 10/02/2021   Supervision of other normal pregnancy, antepartum 07/06/2021   History of gestational hypertension 04/11/2020   She desires IUD for contraception.  She plans to breastfeed, plans to bottle feed  Prenatal labs and studies: ABO, Rh: O/Positive/-- (04/10 1607) Antibody: Negative (04/10 0903) Rubella: 20.70 (04/10 0903) RPR: Non Reactive (08/15 1342)  HBsAg: Negative (04/10 0903)  HIV: Non Reactive (08/15 1342)  GBS: Positive/-- (10/19 1210)  2hr Glucola: positive Genetic screening:  declined Anatomy US: normal  Past Medical History:  Past Medical History:  Diagnosis Date   Acid reflux    Gastritis    Gestational diabetes    History of gestational diabetes 04/11/2020   Pregnancy induced hypertension     Past Surgical History:  Past Surgical History:  Procedure Laterality Date   NO PAST SURGERIES      Obstetrical History:  OB History     Gravida  3   Para  2   Term  2   Preterm      AB      Living  2      SAB      IAB      Ectopic      Multiple  0   Live Births  2           Gynecological History:  OB History     Gravida  3   Para  2   Term  2   Preterm      AB      Living  2      SAB      IAB      Ectopic      Multiple  0   Live Births  2            Social History:  Social History   Socioeconomic History   Marital status: Married    Spouse name: Not on file   Number of children: 2   Years of education: Not on file   Highest education level: Not on file  Occupational History   Not on file  Tobacco Use   Smoking status: Never   Smokeless tobacco: Never  Vaping Use   Vaping Use: Never used  Substance and Sexual Activity   Alcohol use: Never   Drug use: Never   Sexual activity: Yes    Birth control/protection: I.U.D.  Other Topics Concern   Not on file  Social History Narrative   Not on file   Social Determinants of Health   Financial Resource Strain: Not on file  Food Insecurity: No Food Insecurity (01/21/2022)   Hunger Vital Sign    Worried About Running Out of Food in the Last Year: Never true    Ran Out of Food in the Last Year: Never true  Transportation Needs: No  Transportation Needs (01/21/2022)   PRAPARE - Hydrologist (Medical): No    Lack of Transportation (Non-Medical): No  Physical Activity: Not on file  Stress: Not on file  Social Connections: Not on file    Family History:  Family History  Problem Relation Age of Onset   Diabetes Mother    Hypertension Mother    Heart disease Father    Heart disease Maternal Grandmother    Cancer Paternal Grandmother     Medications:  Prenatal vitamins,  Current Facility-Administered Medications  Medication Dose Route Frequency Provider Last Rate Last Admin   acetaminophen (TYLENOL) tablet 650 mg  650 mg Oral Q4H PRN Clarnce Flock, MD       fentaNYL (SUBLIMAZE) injection 50-100 mcg  50-100 mcg Intravenous Q1H PRN Clarnce Flock, MD       lactated ringers infusion 500-1,000 mL  500-1,000 mL Intravenous PRN Clarnce Flock, MD 999 mL/hr at 01/21/22 1200 500 mL at 01/21/22 1200   lactated ringers infusion   Intravenous Continuous Clarnce Flock, MD       lidocaine (PF) (XYLOCAINE) 1 % injection 30 mL  30 mL  Subcutaneous PRN Clarnce Flock, MD       ondansetron [facility]) injection 4 mg  4 mg Intravenous Q6H PRN Clarnce Flock, MD       oxytocin (PITOCIN) IV BOLUS FROM BAG  333 mL Intravenous Once Clarnce Flock, MD       oxytocin (PITOCIN) IV infusion 30 units in NS 500 mL - Premix  2.5 Units/hr Intravenous Continuous Clarnce Flock, MD       sodium citrate-citric acid (ORACIT) solution 30 mL  30 mL Oral Q2H PRN Clarnce Flock, MD       terbutaline (BRETHINE) 1 MG/ML injection            terbutaline (BRETHINE) injection 0.25 mg  0.25 mg Subcutaneous Once Rylinn Linzy J, DO        Allergies: No Known Allergies  Review of Systems: -  negative  Physical Exam: Blood pressure 119/67, pulse 81, resp. rate 17, height 5' 3.75" (1.619 m), weight 85.7 kg, last menstrual period 04/29/2021, currently breastfeeding. GENERAL: Well-developed, well-nourished female in no acute distress.  LUNGS: Clear to auscultation bilaterally.  HEART: Regular rate and rhythm. ABDOMEN: Soft, nontender, nondistended, gravid. EFW 7 lbs EXTREMITIES: Nontender, no edema, 2+ distal pulses. FHT:  Baseline rate 140s bpm   Variability moderate  Accelerations present   Decelerations none Contractions: Every 8 mins   Pertinent Labs/Studies:   Lab Results  Component Value Date   WBC 10.4 11/14/2021   HGB 11.2 11/14/2021   HCT 33.3 (L) 11/14/2021   MCV 85 11/14/2021   PLT 310 11/14/2021    Assessment : Molly Carr is a 31 y.o. G3P2002 at [redacted]w[redacted]d being admitted ECV with planned induction to follow.  Plan: Risks of ECV discussed - including fetal intolerance, pain, placental abruption, need for emergent cesarean delivery.   Truett Mainland, DO 01/21/2022, 12:11 PM

## 2022-01-21 NOTE — Progress Notes (Signed)
At bedside to evaluate fetal presentation.  On [place]- head maternal left.  SVE- hand presentation.  Discussed with patient that due to fetal malpresentation, C-section is indicated.  Risk benefits and alternatives of cesarean section were discussed with the patient including but not limited to infection, bleeding, damage to bowel , bladder and baby with the need for further surgery. Pt voiced understanding and desires to proceed.    Interpreter present at bedside for above discussion. OR notified  Janyth Pupa, DO Attending [place], [facility] for [employer], [place]

## 2022-01-21 NOTE — Progress Notes (Signed)
[id number] Pt called at home by interpreter [facility]) and told to stay home until called for Version. Currently all PACU bays are full and all RN's are assigned to cases. Interpreter states that pt states understanding and will be on stand-by for a phone call when she is called for her version to come into the hospital.

## 2022-01-21 NOTE — Op Note (Addendum)
Molly Carr PROCEDURE DATE: 01/21/2022  PREOPERATIVE DIAGNOSES: Intrauterine pregnancy at [redacted]w[redacted]d weeks gestation; malpresentation: transverse breech Desire for contraception  POSTOPERATIVE DIAGNOSES: The same, viable infant delivered  PROCEDURE: Primary Low Transverse Cesarean Section and IUD insertion  SURGEON:  Dr. Janyth Pupa  ASSISTANT:  Shelda Pal, DO An experienced assistant was required given the standard of surgical care given the complexity of the case.  This assistant was needed for exposure, dissection, suctioning, retraction, instrument exchange, assisting with delivery with administration of fundal pressure, and for overall help during the procedure.  ANESTHESIOLOGY TEAM: Anesthesiologist: Lyn Hollingshead, MD  INDICATIONS: Molly Carr is a 31 y.o. [phone] at [redacted]w[redacted]d here for cesarean section secondary to the indications listed under preoperative diagnoses; please see preoperative note for further details.  The risks of surgery were discussed with the patient including but were not limited to: bleeding which may require transfusion or reoperation; infection which may require antibiotics; injury to bowel, bladder, ureters or other surrounding organs; injury to the fetus; need for additional procedures including hysterectomy in the event of a life-threatening hemorrhage; formation of adhesions; placental abnormalities wth subsequent pregnancies; incisional problems; thromboembolic phenomenon and other postoperative/anesthesia complications.  The patient concurred with the proposed plan, giving informed written consent for the procedure.    FINDINGS:  Viable female infant in cephalic presentation.  Apgars 5 and 9.  Amniotic fluid: clear.  Intact placenta, three vessel cord.  Normal uterus, fallopian tubes and ovaries bilaterally.  ANESTHESIA: epidural INTRAVENOUS FLUIDS: 900 ml   ESTIMATED BLOOD LOSS: 507 ml URINE OUTPUT:  300  ml SPECIMENS: Placenta sent to L&D and arterial cord gas collected COMPLICATIONS: None immediate  PROCEDURE IN DETAIL:  The patient preoperatively received intravenous antibiotics and had sequential compression devices applied to her lower extremities.  She was then taken to the operating room where the epidural was dosed up to a surgical level and found to be adequate. She was then placed in a dorsal supine position with a leftward tilt, and prepped and draped in a sterile manner.  A foley catheter was  was already in place.  After an adequate timeout was performed, a Pfannenstiel skin incision was made with scalpel and carried through to the underlying layer of fascia. The fascia was incised in the midline, and this incision was extended bluntly. The rectus muscles were separated in the midline and the peritoneum was entered bluntly.   The Alexis self-retaining retractor was introduced into the abdominal cavity.  Attention was turned to the lower uterine segment where a low transverse hysterotomy was made with a scalpel and extended bluntly in caudad and cephalad directions.  The infant was noted in transverse position, a foot was identified and the baby was successfully delivered from breech presentation.  The cord was clamped and cut immediately due to lack of spontaneous cry, the infant was handed over to the awaiting neonatology team. Uterine massage was then administered, and the placenta delivered intact with a three-vessel cord. The uterus was then cleared of clots and debris.   A ParaGard IUD was then removed from it's packaging in a sterile manner and placed manually at the uterine fundus and the strings were passed through the cervical os with a Kelly clamp.    The hysterotomy was closed with 0-Vicryl in a running fashion.  Figure-of-eight 0 Vicryl serosal stitches were placed to help with hemostasis.    The pelvis was cleared of all clot and debris. Hemostasis was confirmed on  all  surfaces.  The uterus was once again inspected and found to be hemostatic. Arista was placed.  The retractor was removed.  The fascia was then closed using 0 Vicryl in a running fashion.  The subcutaneous layer was irrigated, any areas of bleeding were cauterized with the bovie,  was reapproximated with 2-0 plain gut in a running fashion, was found to be hemostatic. The skin was closed with a 4-0 Vicryl subcuticular stitch. The patient tolerated the procedure well. Sponge, instrument and needle counts were correct x 3.  She was taken to the recovery room in stable condition.   Myrtie Hawk, DO FMOB Fellow, Faculty practice [facility], Center for [facility] 01/21/22  9:30 PM   Attestation of Attending Supervision of Obstetric Fellow: Evaluation and management procedures were performed by the Obstetric Fellow under my supervision and collaboration.  I have reviewed the Obstetric Fellow's note and chart, and I agree with the management and plan. I have also made any necessary editorial changes.   Sharon Seller, DO Attending Obstetrician & Gynecologist, [facility] for [facility], [facility] 01/23/2022 7:17 AM

## 2022-01-21 NOTE — Anesthesia Preprocedure Evaluation (Signed)
Anesthesia Evaluation  Patient identified by MRN, date of birth, ID band Patient awake    Reviewed: Allergy & Precautions, Patient's Chart, lab work & pertinent test results  Airway Mallampati: I       Dental no notable dental hx.    Pulmonary neg pulmonary ROS,    Pulmonary exam normal        Cardiovascular Normal cardiovascular exam     Neuro/Psych negative neurological ROS  negative psych ROS   GI/Hepatic GERD  Medicated,  Endo/Other  diabetes, Gestational, Oral Hypoglycemic Agents  Renal/GU      Musculoskeletal   Abdominal (+) + obese,   Peds  Hematology   Anesthesia Other Findings   Reproductive/Obstetrics (+) Pregnancy                             Anesthesia Physical  Anesthesia Plan  ASA: II  Anesthesia Plan: Epidural   Post-op Pain Management:    Induction:   PONV Risk Score and Plan:   Airway Management Planned:   Additional Equipment:   Intra-op Plan:   Post-operative Plan:   Informed Consent: I have reviewed the patients History and Physical, chart, labs and discussed the procedure including the risks, benefits and alternatives for the proposed anesthesia with the patient or authorized representative who has indicated his/her understanding and acceptance.     Interpreter used for [employer] Discussed with:   Anesthesia Plan Comments: ( )        Anesthesia Quick Evaluation

## 2022-01-22 ENCOUNTER — Encounter (HOSPITAL_COMMUNITY): Payer: Self-pay | Admitting: Obstetrics & Gynecology

## 2022-01-22 ENCOUNTER — Other Ambulatory Visit: Payer: Self-pay

## 2022-01-22 DIAGNOSIS — Z975 Presence of (intrauterine) contraceptive device: Secondary | ICD-10-CM

## 2022-01-22 LAB — CBC
HCT: 29.3 % — ABNORMAL LOW (ref 36.0–46.0)
Hemoglobin: 10.1 g/dL — ABNORMAL LOW (ref 12.0–15.0)
MCH: 29 pg (ref 26.0–34.0)
MCHC: 34.5 g/dL (ref 30.0–36.0)
MCV: 84.2 fL (ref 80.0–100.0)
Platelets: 238 10*3/uL (ref 150–400)
RBC: 3.48 MIL/uL — ABNORMAL LOW (ref 3.87–5.11)
RDW: 14.2 % (ref 11.5–15.5)
WBC: 12.9 10*3/uL — ABNORMAL HIGH (ref 4.0–10.5)
nRBC: 0 % (ref 0.0–0.2)

## 2022-01-22 LAB — GLUCOSE, CAPILLARY: Glucose-Capillary: 122 mg/dL — ABNORMAL HIGH (ref 70–99)

## 2022-01-22 LAB — RPR: RPR Ser Ql: NONREACTIVE

## 2022-01-22 MED ORDER — IBUPROFEN 600 MG PO TABS
600.0000 mg | ORAL_TABLET | Freq: Four times a day (QID) | ORAL | Status: DC
  Administered 2022-01-23 (×2): 600 mg via ORAL
  Filled 2022-01-22 (×2): qty 1

## 2022-01-22 MED ORDER — KETOROLAC TROMETHAMINE 30 MG/ML IJ SOLN
30.0000 mg | Freq: Four times a day (QID) | INTRAMUSCULAR | Status: AC
  Administered 2022-01-22 – 2022-01-23 (×4): 30 mg via INTRAVENOUS
  Filled 2022-01-22 (×4): qty 1

## 2022-01-22 NOTE — Lactation Note (Signed)
This note was copied from a baby's chart. Lactation Consultation Note  Patient Name: Molly Carr Today's Date: 01/22/2022   Age:31 hours   Confirmed via video interpreter in [place] that mother would only like to formula feed.   Consult Status Consult Status: Complete    Carlye Grippe 01/22/2022, 10:09 AM

## 2022-01-22 NOTE — Progress Notes (Signed)
POSTPARTUM PROGRESS NOTE  POD #1  Subjective:  Molly Carr is a 31 y.o. [phone] s/p primary LTCS at [redacted]w[redacted]d for malpresentation.  She reports she doing well. No acute events overnight. She reports she is doing well, though having som pain. She denies any problems with ambulating, voiding or po intake. Denies nausea or vomiting. She has not passed flatus. Pain is moderately controlled.    Objective: Blood pressure (!) 94/53, pulse 79, temperature 98 F (36.7 C), temperature source Oral, resp. rate 16, height 5' 3.75" (1.619 m), weight 85.7 kg, last menstrual period 04/29/2021, SpO2 97 %, unknown if currently breastfeeding.  Physical Exam:  General: alert, cooperative and no distress Chest: no respiratory distress Heart:regular rate, distal pulses intact Abdomen: soft, nontender,  Uterine Fundus: firm, appropriately tender DVT Evaluation: No calf swelling or tenderness Extremities: minimal LE edema Skin: warm, dry; incision clean/dry/intact w/ honeycomb dressing in place  Recent Labs    01/21/22 1200 01/22/22 0605  HGB 11.7* 10.1*  HCT 34.7* 29.3*    Assessment/Plan: Molly Carr is a 31 y.o. [phone] s/p pLTCS at [redacted]w[redacted]d for malpresentation.  POD#1 - Doing welll; pain moderately controlled, encouraged use of pain meds. H/H appropriate  Routine postpartum care  OOB, ambulated  Lovenox for VTE prophylaxis Anemia: asymptomatic, mild GDM: elevated fasting glucose Contraception: paragard placed post placentally, problem list updated Feeding: both  Dispo: Plan for discharge POD#2.   LOS: 1 day   Clarnce Flock, MD/MPH Attending Family Medicine Physician, [facility] for [facility], [place] [facility]  01/22/2022, 9:30 AM

## 2022-01-23 ENCOUNTER — Other Ambulatory Visit (HOSPITAL_COMMUNITY): Payer: Self-pay

## 2022-01-23 LAB — GLUCOSE, CAPILLARY: Glucose-Capillary: 59 mg/dL — ABNORMAL LOW (ref 70–99)

## 2022-01-23 MED ORDER — OXYCODONE HCL 5 MG PO TABS
5.0000 mg | ORAL_TABLET | ORAL | 0 refills | Status: AC | PRN
  Filled 2022-01-23: qty 30, 7d supply, fill #0

## 2022-01-23 MED ORDER — SENNOSIDES-DOCUSATE SODIUM 8.6-50 MG PO TABS
2.0000 | ORAL_TABLET | Freq: Every day | ORAL | 0 refills | Status: DC
  Filled 2022-01-23: qty 30, 15d supply, fill #0

## 2022-01-23 MED ORDER — IBUPROFEN 600 MG PO TABS
600.0000 mg | ORAL_TABLET | Freq: Four times a day (QID) | ORAL | 0 refills | Status: DC
  Filled 2022-01-23: qty 30, 8d supply, fill #0

## 2022-01-23 MED ORDER — INFLUENZA VAC SPLIT QUAD 0.5 ML IM SUSY
0.5000 mL | PREFILLED_SYRINGE | Freq: Once | INTRAMUSCULAR | Status: AC
  Administered 2022-01-23: 0.5 mL via INTRAMUSCULAR
  Filled 2022-01-23: qty 0.5

## 2022-01-23 MED ORDER — ACETAMINOPHEN 325 MG PO TABS
650.0000 mg | ORAL_TABLET | ORAL | 0 refills | Status: DC | PRN
  Filled 2022-01-23: qty 30, 3d supply, fill #0

## 2022-01-23 NOTE — Progress Notes (Signed)
Fasting blood sugar 59. Orange juice given and breakfast tray was delivered to room. Patient asymptomatic and MD aware in room at that time.

## 2022-01-25 ENCOUNTER — Encounter: Payer: Commercial Managed Care - HMO | Admitting: Obstetrics and Gynecology

## 2022-01-26 ENCOUNTER — Ambulatory Visit: Payer: Commercial Managed Care - HMO

## 2022-01-29 ENCOUNTER — Ambulatory Visit (INDEPENDENT_AMBULATORY_CARE_PROVIDER_SITE_OTHER): Payer: Commercial Managed Care - HMO | Admitting: General Practice

## 2022-01-29 DIAGNOSIS — Z4889 Encounter for other specified surgical aftercare: Secondary | ICD-10-CM

## 2022-01-29 NOTE — Progress Notes (Signed)
The wound is cleansed and debrided of foreign material as much as possible. Dressing was removed by pt at home. Incision was clean, dry and intact. The patient is alerted to watch for any signs of infection (redness, pus, pain, increased swelling or fever) and call if such occurs. Pt verbalized understanding. Home wound care instructions are provided. Tetanus vaccination status reviewed: last tetanus booster within 10 years.

## 2022-02-02 ENCOUNTER — Inpatient Hospital Stay (HOSPITAL_COMMUNITY): Payer: Commercial Managed Care - HMO

## 2022-02-02 LAB — GLUCOSE, CAPILLARY
Glucose-Capillary: 140 mg/dL — ABNORMAL HIGH (ref 70–99)
Glucose-Capillary: 94 mg/dL (ref 70–99)

## 2022-02-28 ENCOUNTER — Ambulatory Visit (INDEPENDENT_AMBULATORY_CARE_PROVIDER_SITE_OTHER): Payer: Commercial Managed Care - HMO

## 2022-02-28 ENCOUNTER — Other Ambulatory Visit: Payer: Commercial Managed Care - HMO

## 2022-02-28 DIAGNOSIS — Z758 Other problems related to medical facilities and other health care: Secondary | ICD-10-CM

## 2022-02-28 DIAGNOSIS — O24419 Gestational diabetes mellitus in pregnancy, unspecified control: Secondary | ICD-10-CM | POA: Diagnosis not present

## 2022-02-28 DIAGNOSIS — Z789 Other specified health status: Secondary | ICD-10-CM

## 2022-02-28 NOTE — Patient Instructions (Signed)
AREA FAMILY PRACTICE PHYSICIANS  [place] ([address]) [facility] [address] [place], [place] [address] [phone] Mon-Fri 8:30-12:30, 1:30-5:00 Accepting Medicaid [facility] [address], [place], [place] [address] [phone] Mon-Fri 8:00-5:30 [facility] [address] [place], [place] [address] [phone] Mon, Tue, Thur, Fri 8:30-5:00, Wed 10:00-7:00 (closed 1-2pm) Accepting Medicaid [facility] [address], [place], [place]  [address] Phone - [phone]   Fax - [phone]  [place] ([address]) [facility] [address] [place], [place] [address] [phone] Mon-Fri 8:00-5:00 [facility] ([facility])  [address] [place], [place] [address] [phone] Mon-Fri 8:30-5:30, Sat (Oct.-Mar.) 9:00-1:00 Accepting Medicaid  [place] ([address]) [facility] [address], [place], [place] [address] [phone] Mon-Fri 8:00-5:00  [place] ([address]) [facility] [address], [place], [place] [address] [phone] Mon-Fri 8:00-5:00 [facility] [address], [place], [place] [address] [phone] Mon-Fri 8:00-5:00 [facility] [address] [place], [place] [address] [phone] Mon-Fri 8:00-5:00 [facility] [address] [place] [address] [phone] Mon-Fri 7:30-5:30  [place] ([address] & [address]) [facility] [address] [place], [place] [address] [phone] Mon-Thur 8:00-6:00 Accepting Medicaid [facility] [address] [place], [place] [address] [phone] Mon-Thur 7:30-7:30, Fri 7:30-4:30 Accepting  Medicaid [facility] [place] [address], [place], [place]  [address] [phone]   Fax - [phone]  [place] ([address] & [address]) [facility] [address] [place], [place] [address] [phone] Mon-Fri 7:00-5:00 [facility] [address], [place], [place] [address] [phone] Mon-Fri 8:00-5:00 Accepting Medicaid [facility] [address], [place], [place] [address] [phone] Mon-Fri 8:00-5:00 Accepting Medicaid  [place] ([address]) [facility] [place] [address] [place], [place] [address] [phone] Mon-Fri 8:00-5:00 [facility] [address], [place], [place] [address] [phone] Mon-Fri 8:00-5:00 Accepting Medicaid [facility] ([facility]) [address], [place], [place] [address] [phone] Mon-Fri 8:00-5:30, Sat&Sun by appointment (phones open at 8:30) Accepting Medicaid  [place] ([address]) [facility] [address] [place], [place] [address] [phone] Mon-Thur 8:00-7:00, Fri 8:00-5:00, Sat 8:00-12:00, Sun 9:00-12:00 Accepting Medicaid [facility] [address], [place], [place] [address] [phone] Mon-Thur 8:00-5:00 Accepting Medicaid [facility] [address] [place], [place] [address] [phone] Mon-Fri 8:00-5:30, Sat (Oct.-Mar.) 9:00-1:00 Accepting Medicaid  [place] ([address]) [facility] [address], [place], [place] [address] [phone] Mon-Fri 8:00-5:00 Accepting Medicaid   [place] ([address]) [facility] [place] [address], [place], [place] [address] [phone] Mon-Fri 8:00-5:00 [facility] [place] [address], [place], [place] [address] [phone] Mon-Fri 8:00-5:00 [facility] [place] [address] [place] [address], [place], [place] [address] [phone] Mon-Fri 8:00-5:00 After hours clinic ([address] [place], [place] [address]) [phone] Mon-Fri 5:00-8:00, Sat 12:00-6:00, Sun 10:00-4:00 Accepting Medicaid [facility] [place]   [address], [place], [place]  [address] [phone]   Fax - [phone]  [place] ([address]) [facility] [place] [address], [place], [place] [address] [phone] Mon-Fri 8:00-5:00 [facility] [place] ([facility] [place]) [address], [place], [place] [address] [phone] Mon-Thur 8:00-7:00, Fri 8:00-5:00, Sat 8:00-12:00    

## 2022-02-28 NOTE — Progress Notes (Signed)
..   Molly Carr  Molly Carr is a 31 y.o. [phone] female who presents for a postpartum visit. She is 5 weeks postpartum following a primary cesarean section.  I have fully reviewed the prenatal and intrapartum course. The delivery was at 37.2 gestational weeks.  Anesthesia: epidural. Postpartum course has been good. Baby is doing well. Baby is feeding by both breast and bottle - Similac 360 . Bleeding no bleeding. Bowel function is normal. Bladder function is normal. Patient is not sexually active. Contraception method is IUD. Postpartum depression screening: negative.  Patient reports she is feeling better.  She receives help from her niece with infant care. She endorses safety at home.   The pregnancy intention screening data noted above was reviewed. Potential methods of contraception were discussed. The patient elected to proceed with No data recorded.    Health Maintenance Due  Topic Date Due   FOOT EXAM  Never done   OPHTHALMOLOGY EXAM  Never done   Diabetic kidney evaluation - Urine ACR  02/27/2021   COVID-19 Vaccine (3 - 2023-24 season) 12/01/2021   HEMOGLOBIN A1C  01/09/2022    The following portions of the patient's history were reviewed and updated as appropriate: allergies, current medications, past family history, past medical history, past social history, past surgical history, and problem list.  Review of Systems Pertinent items noted in HPI and remainder of comprehensive ROS otherwise negative.  Objective:  LMP 04/29/2021 (Exact Date)    General:  alert, cooperative, and no distress   Breasts:  normal  Lungs: clear to auscultation bilaterally  Heart:  regular rate and rhythm  Abdomen: normal findings: bowel sounds normal   Wound well approximated incision  GU exam:  normal external genitalia.  Vaginal walls pink. Moderate amt white discharge. Cervix pink w/o lesions.  White IUD strings appreciated ~ 2 cm from os.       Assessment:    5 weeks Postpartum Breastfeeding IUD-Paragard Language Barrier  Plan:   Okay to return to normal activities including work and sex as tolerated. Discussed returning to exercise gradually and increasing to previous state. In-person Interpretations completed with assistance of [place]. Reassured that IUD strings identified.  Instructed to return yearly for IUD string check and pelvic exam.  Informed that pap smear not due until at least 2026.  GTT completed, if abnormal plan to follow up with PCP.  List provided in AVS.  Patient endorses ability to read [place].  Will send results to mychart.   1.  Mood and well being: Patient with negative depression screening today. Reviewed local resources for support.  - Patient tobacco use? No.   - hx of drug use? No.    2. Infant care and feeding:  -Patient currently breastmilk feeding? Yes. Reviewed importance of draining breast regularly to support lactation.  -Social determinants of health (SDOH) reviewed in EPIC. No concerns  3. Sexuality, contraception and birth spacing - Patient does want a pregnancy in the next year.  Desired family size is 4 children.  - Reviewed reproductive life planning. Reviewed contraceptive methods based on pt preferences and effectiveness.  Patient desired IUD or IUS which was placed Postplacentally.   - Discussed birth spacing of 18 months  4. Sleep and fatigue: She endorses getting at least 3 hours every night and naps during the day.  -Encouraged family/partner/community support of 4 hrs of uninterrupted sleep to help with mood and fatigue  5. Physical Recovery  - Discussed patients delivery  and complications. - Patient had a C-section. Patient had a  no  laceration. Perineal healing reviewed. Patient expressed understanding - Patient has urinary incontinence? No. - Patient is safe to resume physical and sexual activity  6.  Health Maintenance - HM due items addressed Yes - Last pap smear  Diagnosis   Date Value Ref Range Status  07/10/2021   Final   - Negative for intraepithelial lesion or malignancy (NILM)   Pap smear done at today's visit.  -Breast Cancer screening indicated? No.   7. Chronic Disease/Pregnancy Condition follow up: Gestational Diabetes -2hr completed today -Informed that if abnormal, would need to follow up with PCP. - PCP follow up  Molly Carr, CNM [facility] for [facility], [facility]

## 2022-03-01 ENCOUNTER — Ambulatory Visit: Payer: Commercial Managed Care - HMO | Admitting: Obstetrics

## 2022-03-01 LAB — GLUCOSE TOLERANCE, 2 HOURS
Glucose, 2 hour: 184 mg/dL — ABNORMAL HIGH (ref 70–139)
Glucose, GTT - Fasting: 98 mg/dL (ref 70–99)

## 2022-06-14 ENCOUNTER — Ambulatory Visit: Payer: Medicaid Other | Attending: Family Medicine | Admitting: Family Medicine

## 2022-06-14 ENCOUNTER — Encounter: Payer: Self-pay | Admitting: Family Medicine

## 2022-06-14 VITALS — BP 106/71 | HR 73 | Ht 63.0 in | Wt 176.2 lb

## 2022-06-14 DIAGNOSIS — Z13228 Encounter for screening for other metabolic disorders: Secondary | ICD-10-CM

## 2022-06-14 DIAGNOSIS — R7303 Prediabetes: Secondary | ICD-10-CM

## 2022-06-14 DIAGNOSIS — K2950 Unspecified chronic gastritis without bleeding: Secondary | ICD-10-CM | POA: Diagnosis not present

## 2022-06-14 LAB — POCT GLYCOSYLATED HEMOGLOBIN (HGB A1C): HbA1c, POC (controlled diabetic range): 5.8 % (ref 0.0–7.0)

## 2022-06-14 NOTE — Progress Notes (Signed)
Abdominal bloating and burning.

## 2022-06-14 NOTE — Progress Notes (Signed)
Subjective:  Patient ID: Molly Carr, female    DOB: 10/02/1990  Age: 32 y.o. MRN: [id number]  CC: New Patient (Initial Visit)   HPI Molly Carr is a 32 y.o. year old female with a history of gestational diabetes mellitus presents today to establish care.  She is accompanied by an in person interpreter.  Interval History:  She has had abdominal burning present all the time with associated with reflux for 3-4 years. She sometimes uses Omeprazole with some relief. She last used Omeprazole 1 month ago. She has no nausea, vomiting, nausea, constipation, but does have bloating.  Her last meal of the day is at 7pm.  Past Medical History:  Diagnosis Date   Acid reflux    Gastritis    Gestational diabetes    History of gestational diabetes 04/11/2020   Pregnancy induced hypertension     Past Surgical History:  Procedure Laterality Date   CESAREAN SECTION N/A 01/21/2022   Procedure: CESAREAN SECTION;  Surgeon: Janyth Pupa, DO;  Location: [facility] LD ORS;  Service: Obstetrics;  Laterality: N/A;   EXTERNAL CEPHALIC VERSION  [id number]   INTRAUTERINE DEVICE (IUD) INSERTION N/A 01/21/2022   Procedure: INTRAUTERINE DEVICE (IUD) INSERTION;  Surgeon: Janyth Pupa, DO;  Location: [facility]S;  Service: Obstetrics;  Laterality: N/A;   NO PAST SURGERIES      Family History  Problem Relation Age of Onset   Diabetes Mother    Hypertension Mother    Heart disease Father    Heart disease Maternal Grandmother    Cancer Paternal Grandmother     Social History   Socioeconomic History   Marital status: Married    Spouse name: Not on file   Number of children: 2   Years of education: Not on file   Highest education level: Not on file  Occupational History   Not on file  Tobacco Use   Smoking status: Never   Smokeless tobacco: Never  Vaping Use   Vaping Use: Never used  Substance and Sexual Activity   Alcohol use: Never   Drug use: Never    Sexual activity: Yes    Birth control/protection: I.U.D.  Other Topics Concern   Not on file  Social History Narrative   Not on file   Social Determinants of Health   Financial Resource Strain: Not on file  Food Insecurity: No Food Insecurity (01/21/2022)   Hunger Vital Sign    Worried About Running Out of Food in the Last Year: Never true    Ran Out of Food in the Last Year: Never true  Transportation Needs: No Transportation Needs (01/21/2022)   PRAPARE - Hydrologist (Medical): No    Lack of Transportation (Non-Medical): No  Physical Activity: Not on file  Stress: Not on file  Social Connections: Not on file    No Known Allergies  Outpatient Medications Prior to Visit  Medication Sig Dispense Refill   Prenatal Vit-Fe Fumarate-FA (MULTIVITAMIN-PRENATAL) 27-0.8 MG TABS tablet Take 1 tablet by mouth daily at 12 noon.     acetaminophen (TYLENOL) 325 MG tablet Take 2 tablets (650 mg total) by mouth every 4 (four) hours as needed for mild pain (temperature > 101.5.). (Patient not taking: Reported on 02/28/2022) 30 tablet 0   ibuprofen (ADVIL) 600 MG tablet Take 1 tablet (600 mg total) by mouth every 6 (six) hours. (Patient not taking: Reported on 02/28/2022) 30 tablet 0   senna-docusate (SENOKOT-S) 8.6-50  MG tablet Take 2 tablets by mouth daily. (Patient not taking: Reported on 02/28/2022) 30 tablet 0   No facility-administered medications prior to visit.     ROS Review of Systems General: negative for fever, weight loss, appetite change Eyes: no visual symptoms. ENT: no ear symptoms, no sinus tenderness, no nasal congestion or sore throat. Neck: no pain  Respiratory: no wheezing, shortness of breath, cough Cardiovascular: no chest pain, no dyspnea on exertion, no pedal edema, no orthopnea. Gastrointestinal: +abdominal pain, no diarrhea, no constipation Genito-Urinary: no urinary frequency, no dysuria, no polyuria. Hematologic: no  bruising Endocrine: no cold or heat intolerance Neurological: no headaches, no seizures, no tremors Musculoskeletal: no joint pains, no joint swelling Skin: no pruritus, no rash. Psychological: no depression, no anxiety,   Objective:  BP 106/71   Pulse 73   Ht '5\' 3"'$  (1.6 m)   Wt 176 lb 3.2 oz (79.9 kg)   SpO2 97%   BMI 31.21 kg/m      06/14/2022    9:17 AM 02/28/2022    9:23 AM 01/29/2022    1:11 PM  BP/Weight  Systolic BP [id number]  Diastolic BP 71 73 78  Wt. (Lbs) 176.2 181.9 178.8  BMI 31.21 kg/m2 31.44 kg/m2 30.9 kg/m2      Physical Exam Constitutional: normal appearing,  Eyes: PERRLA HEENT: Head is atraumatic, normal sinuses, normal oropharynx, normal appearing tonsils and palate, tympanic membrane is normal bilaterally. Neck: normal range of motion, no thyromegaly, no JVD Cardiovascular: normal rate and rhythm, normal heart sounds, no murmurs, rub or gallop, no pedal edema Respiratory: Normal breath sounds, clear to auscultation bilaterally, no wheezes, no rales, no rhonchi Abdomen: soft, not tender to palpation, normal bowel sounds, no enlarged organs Musculoskeletal: Full ROM, no tenderness in joints Skin: warm and dry, no lesions. Neurological: alert, oriented x3, cranial nerves I-XII grossly intact , normal motor strength, normal sensation. Psychological: normal mood.     Latest Ref Rng & Units 06/24/2021    9:20 PM 02/28/2020    9:56 PM 12/20/2018    4:01 PM  CMP  Glucose 70 - 99 mg/dL 113  124  138   BUN 6 - 20 mg/dL '11  10  12   '$ Creatinine 0.44 - 1.00 mg/dL 0.53  0.59  0.69   Sodium 135 - 145 mmol/L 135  136  136   Potassium 3.5 - 5.1 mmol/L 3.9  3.9  3.8   Chloride 98 - 111 mmol/L 104  107  101   CO2 22 - 32 mmol/L '23  19  25   '$ Calcium 8.9 - 10.3 mg/dL 9.2  9.2  9.0   Total Protein 6.5 - 8.1 g/dL 7.2  6.3  7.7   Total Bilirubin 0.3 - 1.2 mg/dL 0.7  0.4  0.6   Alkaline Phos 38 - 126 U/L 63  91  70   AST 15 - 41 U/L 14  17  134   ALT 0 - 44 U/L  23  13  89     Lipid Panel  No results found for: "CHOL", "TRIG", "HDL", "CHOLHDL", "VLDL", "LDLCALC", "LDLDIRECT"  CBC    Component Value Date/Time   WBC 12.9 (H) 01/22/2022 0605   RBC 3.48 (L) 01/22/2022 0605   HGB 10.1 (L) 01/22/2022 0605   HGB 11.2 11/14/2021 1342   HCT 29.3 (L) 01/22/2022 0605   HCT 33.3 (L) 11/14/2021 1342   PLT 238 01/22/2022 0605   PLT 310 11/14/2021 1342   MCV  84.2 01/22/2022 0605   MCV 85 11/14/2021 1342   MCH 29.0 01/22/2022 0605   MCHC 34.5 01/22/2022 0605   RDW 14.2 01/22/2022 0605   RDW 13.5 11/14/2021 1342   LYMPHSABS 2.0 07/10/2021 0903   MONOABS 0.6 06/24/2021 2120   EOSABS 0.1 07/10/2021 0903   BASOSABS 0.0 07/10/2021 [id number]    Lab Results  Component Value Date   HGBA1C 5.8 06/14/2022    Assessment & Plan:  1. Prediabetes Labs reveal prediabetes with an A1c of 5.8.  Working on a low carbohydrate diet, exercise, weight loss is recommended in order to prevent progression to type 2 diabetes mellitus.  - POCT glycosylated hemoglobin (Hb A1C)  2. Chronic gastritis without bleeding, unspecified gastritis type If H. pylori breath test is negative will place on omeprazole Advised to avoid recumbency up to 2 hours postmeal, avoid late meals, avoid foods that trigger symptoms. - H. pylori breath test  3. Screening for metabolic disorder - LP+Non-HDL Cholesterol - CMP14+EGFR - CBC with Differential/Platelet    No orders of the defined types were placed in this encounter.   Follow-up: Return in about 6 months (around 12/15/2022) for prediabetes.       Charlott Rakes, MD, FAAFP. [facility] and [place], [place]   06/14/2022, 10:27 AM

## 2022-06-15 ENCOUNTER — Ambulatory Visit: Payer: Medicaid Other | Attending: Family Medicine

## 2022-06-16 LAB — CMP14+EGFR
ALT: 17 IU/L (ref 0–32)
AST: 15 IU/L (ref 0–40)
Albumin/Globulin Ratio: 1.5 (ref 1.2–2.2)
Albumin: 4.5 g/dL (ref 3.9–4.9)
Alkaline Phosphatase: 92 IU/L (ref 44–121)
BUN/Creatinine Ratio: 26 — ABNORMAL HIGH (ref 9–23)
BUN: 11 mg/dL (ref 6–20)
Bilirubin Total: 0.3 mg/dL (ref 0.0–1.2)
CO2: 21 mmol/L (ref 20–29)
Calcium: 9.5 mg/dL (ref 8.7–10.2)
Chloride: 102 mmol/L (ref 96–106)
Creatinine, Ser: 0.42 mg/dL — ABNORMAL LOW (ref 0.57–1.00)
Globulin, Total: 3 g/dL (ref 1.5–4.5)
Glucose: 86 mg/dL (ref 70–99)
Potassium: 4.4 mmol/L (ref 3.5–5.2)
Sodium: 137 mmol/L (ref 134–144)
Total Protein: 7.5 g/dL (ref 6.0–8.5)
eGFR: 133 mL/min/{1.73_m2} (ref >59)

## 2022-06-16 LAB — LP+NON-HDL CHOLESTEROL
Cholesterol, Total: 201 mg/dL — ABNORMAL HIGH (ref 100–199)
HDL: 48 mg/dL (ref >39)
LDL Chol Calc (NIH): 117 mg/dL — ABNORMAL HIGH (ref 0–99)
Total Non-HDL-Chol (LDL+VLDL): 153 mg/dL — ABNORMAL HIGH (ref 0–129)
Triglycerides: 207 mg/dL — ABNORMAL HIGH (ref 0–149)
VLDL Cholesterol Cal: 36 mg/dL (ref 5–40)

## 2022-06-16 LAB — CBC WITH DIFFERENTIAL/PLATELET
Basophils Absolute: 0 10*3/uL (ref 0.0–0.2)
Basos: 1 %
EOS (ABSOLUTE): 0.1 10*3/uL (ref 0.0–0.4)
Eos: 1 %
Hematocrit: 37.3 % (ref 34.0–46.6)
Hemoglobin: 12.2 g/dL (ref 11.1–15.9)
Immature Grans (Abs): 0 10*3/uL (ref 0.0–0.1)
Immature Granulocytes: 0 %
Lymphocytes Absolute: 2 10*3/uL (ref 0.7–3.1)
Lymphs: 31 %
MCH: 27.9 pg (ref 26.6–33.0)
MCHC: 32.7 g/dL (ref 31.5–35.7)
MCV: 85 fL (ref 79–97)
Monocytes Absolute: 0.4 10*3/uL (ref 0.1–0.9)
Monocytes: 7 %
Neutrophils Absolute: 3.8 10*3/uL (ref 1.4–7.0)
Neutrophils: 60 %
Platelets: 389 10*3/uL (ref 150–450)
RBC: 4.37 x10E6/uL (ref 3.77–5.28)
RDW: 14.1 % (ref 11.7–15.4)
WBC: 6.4 10*3/uL (ref 3.4–10.8)

## 2022-06-16 LAB — H. PYLORI BREATH TEST: H pylori Breath Test: POSITIVE — AB

## 2022-06-18 ENCOUNTER — Other Ambulatory Visit: Payer: Self-pay | Admitting: Family Medicine

## 2022-06-18 ENCOUNTER — Encounter: Payer: Self-pay | Admitting: Family Medicine

## 2022-06-18 DIAGNOSIS — B9681 Helicobacter pylori [H. pylori] as the cause of diseases classified elsewhere: Secondary | ICD-10-CM | POA: Insufficient documentation

## 2022-06-18 MED ORDER — CLARITHROMYCIN 500 MG PO TABS: 500.0000 mg | ORAL_TABLET | Freq: Two times a day (BID) | ORAL | 0 refills | Status: DC

## 2022-06-18 MED ORDER — OMEPRAZOLE 20 MG PO CPDR: 20.0000 mg | DELAYED_RELEASE_CAPSULE | Freq: Two times a day (BID) | ORAL | 0 refills | Status: AC

## 2022-06-18 MED ORDER — AMOXICILLIN 500 MG PO CAPS: 1000.0000 mg | ORAL_CAPSULE | Freq: Two times a day (BID) | ORAL | 0 refills | Status: DC

## 2022-06-20 ENCOUNTER — Encounter: Payer: Self-pay | Admitting: Family Medicine

## 2022-07-23 ENCOUNTER — Ambulatory Visit: Payer: Medicaid Other | Attending: Family Medicine

## 2022-07-23 ENCOUNTER — Other Ambulatory Visit: Payer: Self-pay

## 2022-07-23 DIAGNOSIS — B9681 Helicobacter pylori [H. pylori] as the cause of diseases classified elsewhere: Secondary | ICD-10-CM

## 2022-07-23 DIAGNOSIS — O24415 Gestational diabetes mellitus in pregnancy, controlled by oral hypoglycemic drugs: Secondary | ICD-10-CM

## 2022-07-25 LAB — H. PYLORI BREATH TEST: H pylori Breath Test: NEGATIVE

## 2022-07-31 ENCOUNTER — Ambulatory Visit: Payer: Medicaid Other | Admitting: Obstetrics & Gynecology

## 2022-08-03 ENCOUNTER — Ambulatory Visit: Payer: Medicaid Other | Admitting: Obstetrics and Gynecology

## 2022-08-03 ENCOUNTER — Encounter: Payer: Self-pay | Admitting: Obstetrics and Gynecology

## 2022-08-03 VITALS — BP 104/69 | HR 73 | Wt 174.0 lb

## 2022-08-03 DIAGNOSIS — Z30431 Encounter for routine checking of intrauterine contraceptive device: Secondary | ICD-10-CM | POA: Diagnosis not present

## 2022-08-03 NOTE — Progress Notes (Signed)
32 y.o. GYN presents for String Check.

## 2022-08-03 NOTE — Progress Notes (Signed)
  CC: IUD check Subjective:    Patient ID: Molly Carr, female    DOB: Jun 09, 1990, 32 y.o.   MRN: [id number]  HPI 32 yo G3P3 seen for IUD check.  She has had no issues, but was told if she had a heavier period to come and get the IUD check. Pt was reassured.   Review of Systems     Objective:   Physical Exam Vitals:   08/03/22 1102  BP: 104/69  Pulse: 73    SSE: IUD strings easily seen, 2-3 cm from cervical os.      Assessment & Plan:   1. IUD check up Appropriate IUD placement noted. Pt advised to follow up in 1 year or prn    Warden Fillers, MD Faculty Attending, Center for [facility]

## 2022-09-12 IMAGING — US US OB < 14 WEEKS - US OB TV
1 series · 15 of 28 positions shown · non-contrast
Comparison: None.

CLINICAL DATA: Positive pregnancy test with vaginal bleeding

EXAM:
OBSTETRIC <14 WK US AND TRANSVAGINAL OB US
TECHNIQUE: Both transabdominal and transvaginal ultrasound examinations were
performed for complete evaluation of the gestation as well as the
maternal uterus, adnexal regions, and pelvic cul-de-sac.
Transvaginal technique was performed to assess early pregnancy.

[Series 1: us ob < 14 weeks - us ob tv · 15 of 37 slices shown]
[im 1/37]
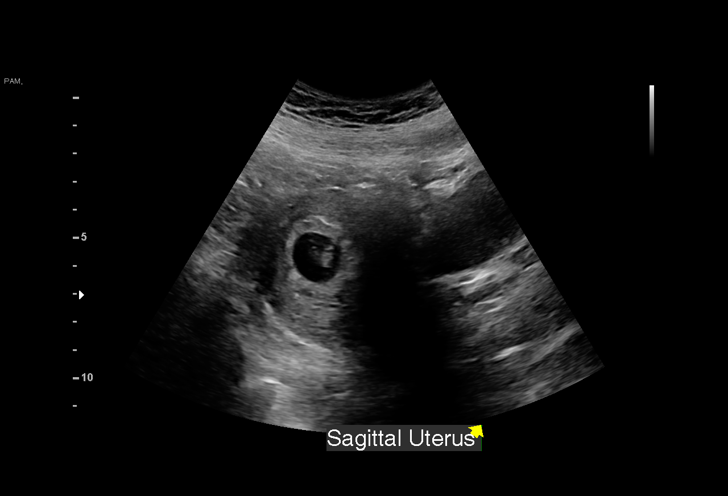
[im 3/37]
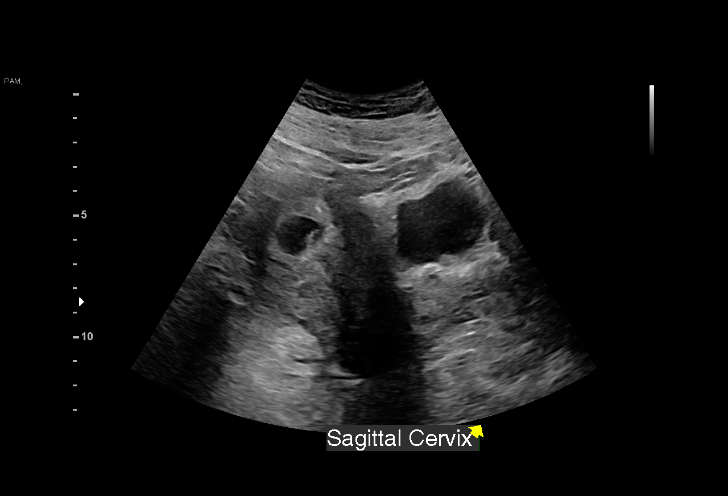
[im 6/37]
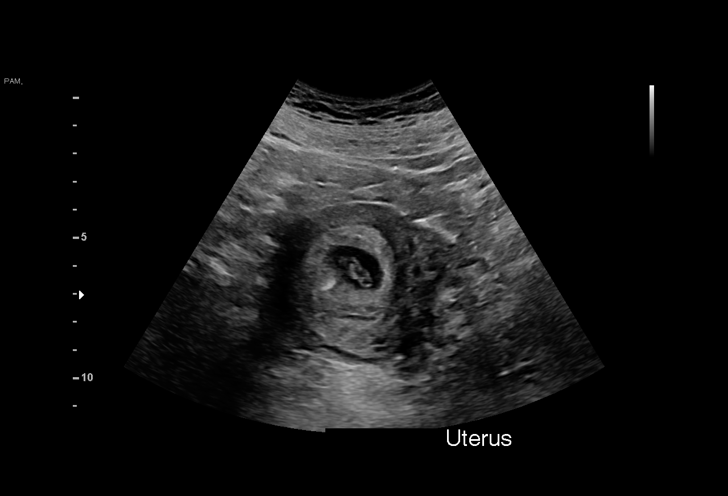
[im 9/37]
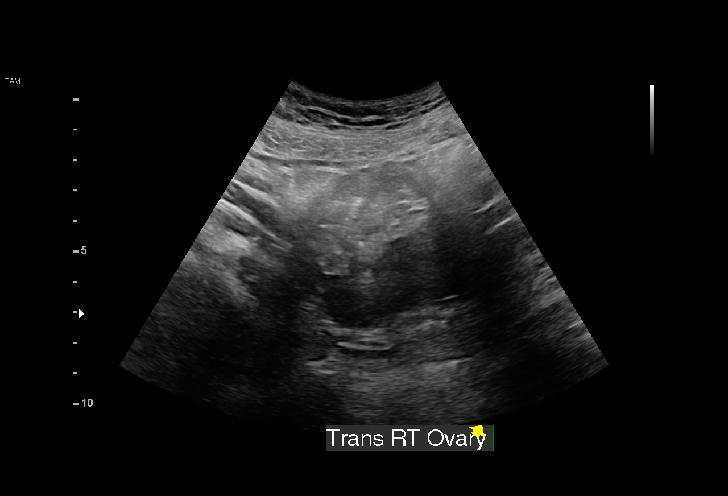
[im 11/37]
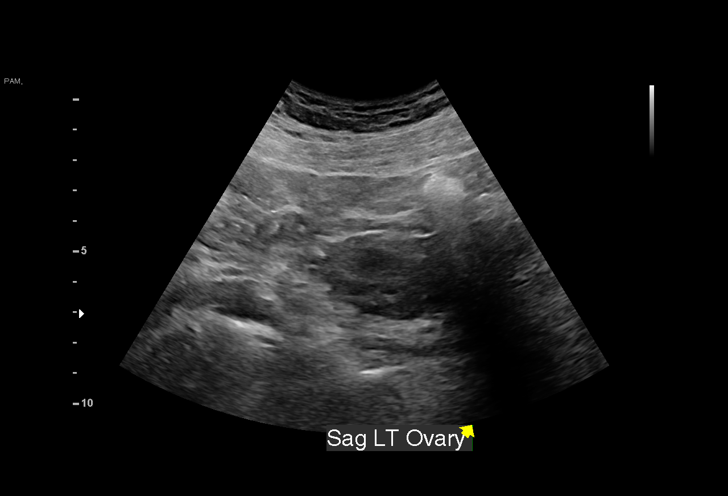
[im 14/37]
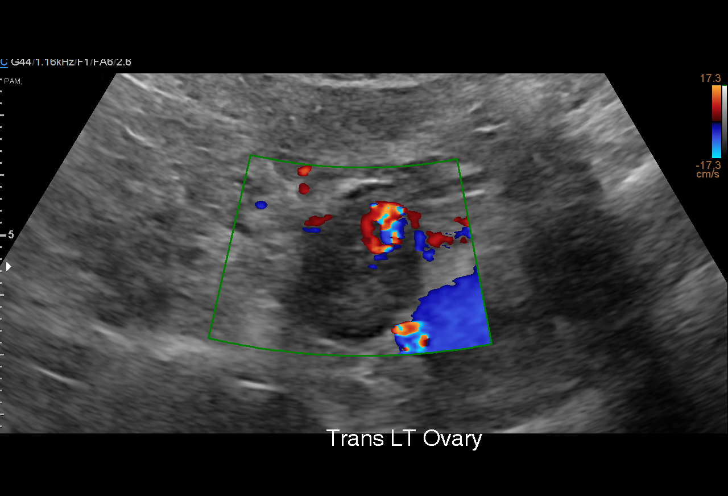
[im 17/37]
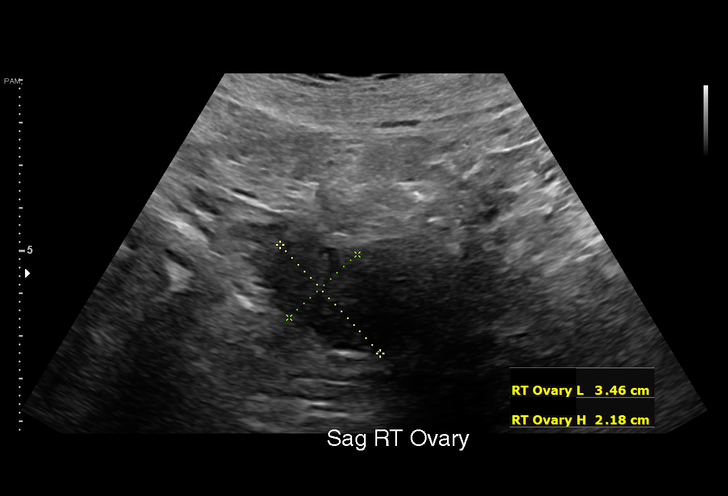
[im 19/37]
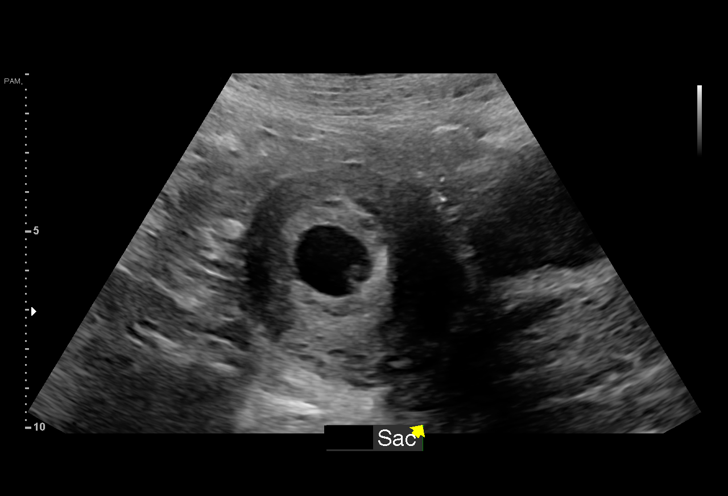
[im 21/37]
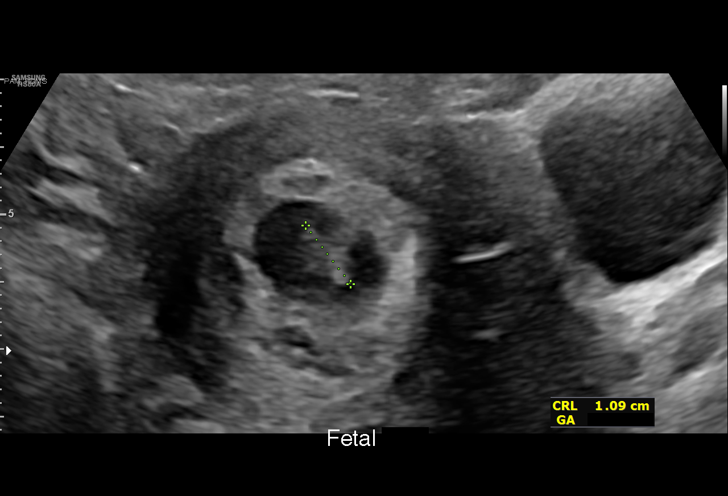
[im 23/37]
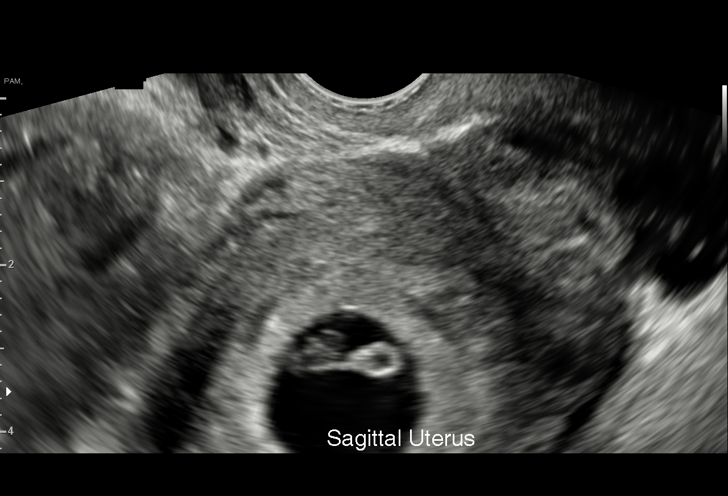
[im 26/37]
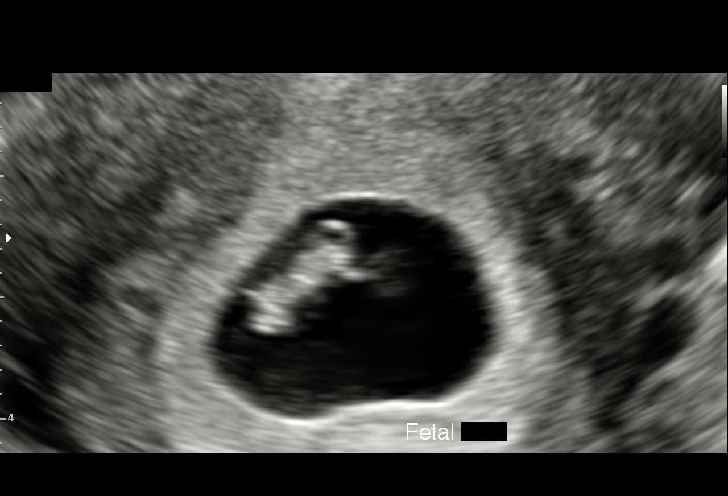
[im 29/37]
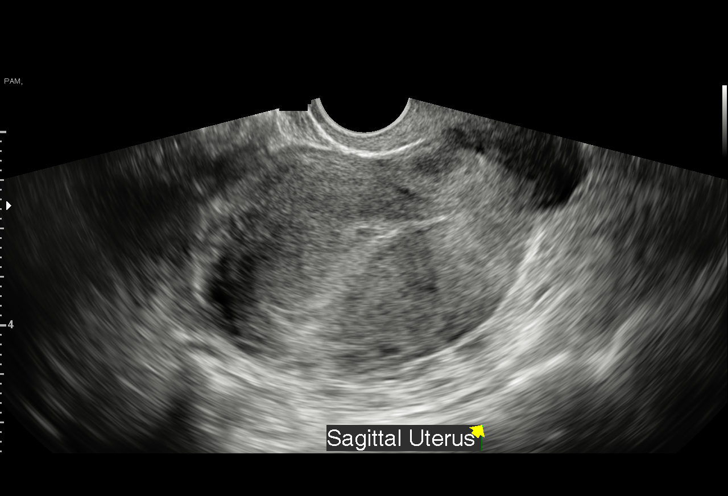
[im 31/37]
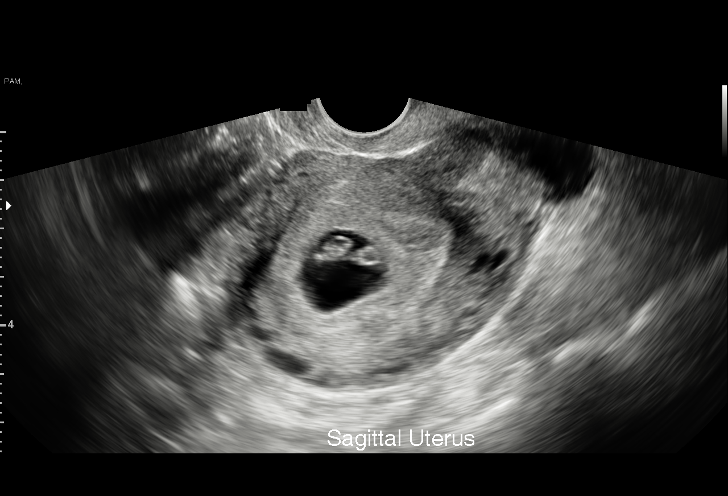
[im 34/37]
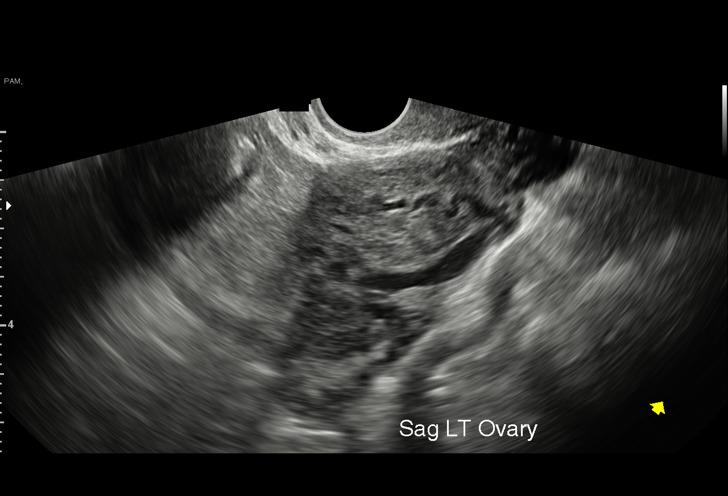
[im 37/37]
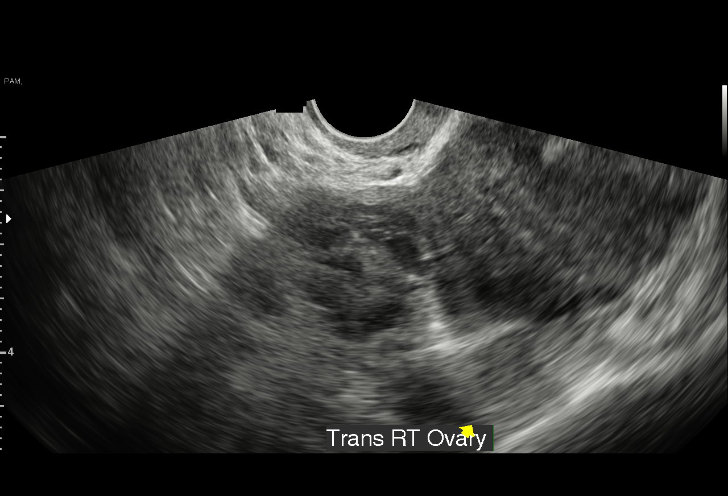

[15 of 28 positions shown; findings below may reference images not displayed]

FINDINGS: Intrauterine gestational sac: Present

Yolk sac:  Present

Embryo:  Present

Cardiac Activity: Present

Heart Rate: 133 bpm

CRL:  11 mm   7 w   1 d                  US EDC: 02/09/2022

Subchorionic hemorrhage:  None visualized.

Maternal uterus/adnexae: Within normal limits.
IMPRESSION: Single live intrauterine gestation at 7 weeks 1 day. No acute
abnormality noted.

## 2022-11-15 ENCOUNTER — Ambulatory Visit
Admission: EM | Admit: 2022-11-15 | Discharge: 2022-11-15 | Disposition: A | Discharge status: Home / Self Care | Payer: Medicaid Other | Attending: Internal Medicine | Admitting: Internal Medicine

## 2022-11-15 ENCOUNTER — Encounter: Payer: Self-pay | Admitting: *Deleted

## 2022-11-15 ENCOUNTER — Other Ambulatory Visit: Payer: Self-pay

## 2022-11-15 DIAGNOSIS — N61 Mastitis without abscess: Secondary | ICD-10-CM | POA: Diagnosis not present

## 2022-11-15 MED ORDER — CEPHALEXIN 500 MG PO CAPS: 500.0000 mg | ORAL_CAPSULE | Freq: Four times a day (QID) | ORAL | 0 refills | Status: AC

## 2022-11-15 NOTE — ED Triage Notes (Signed)
Intake obtained utilizing video interpreter Cammy Copa [phone]' Right breast hurts. Felt a bump yesterday. Lactation consultant advised her to use ice but did not help. She ? Mastitis. She is breastfeeding 2 children (9 mons and 2 yrs). Bump is painful and senstivie. Using advil at night for sleep

## 2022-11-15 NOTE — ED Provider Notes (Signed)
[facility]    CSN: [id number] Arrival date & time: 11/15/22  1129      History   Chief Complaint No chief complaint on file.   HPI Molly Carr is a 32 y.o. female.   Patient presents today for concern of right breast pain that started a few days ago.  Reports that she notified her lactational consultant who advised her to use ice application and continue to breast-feed on that side.  Patient states that she noticed a lump yesterday which concerned her.  Denies any fever.  She reports that she is currently breast-feeding 2 children.     Past Medical History:  Diagnosis Date   Acid reflux    Gastritis    Gestational diabetes    History of gestational diabetes 04/11/2020   Pregnancy induced hypertension     Patient Active Problem List   Diagnosis Date Noted   Helicobacter pylori gastritis 06/18/2022   IUD (intrauterine device) in place 01/22/2022   Language barrier 11/29/2021    Past Surgical History:  Procedure Laterality Date   CESAREAN SECTION N/A 01/21/2022   Procedure: CESAREAN SECTION;  Surgeon: Myna Hidalgo, DO;  Location: [facility]S;  Service: Obstetrics;  Laterality: N/A;   EXTERNAL CEPHALIC VERSION  01/21/2022   INTRAUTERINE DEVICE (IUD) INSERTION N/A 01/21/2022   Procedure: INTRAUTERINE DEVICE (IUD) INSERTION;  Surgeon: Myna Hidalgo, DO;  Location: [facility];  Service: Obstetrics;  Laterality: N/A;   NO PAST SURGERIES      OB History     Gravida  3   Para  3   Term  3   Preterm      AB      Living  3      SAB      IAB      Ectopic      Multiple  0   Live Births  3            Home Medications    Prior to Admission medications   Medication Sig Start Date End Date Taking? Authorizing Provider  cephALEXin (KEFLEX) 500 MG capsule Take 1 capsule (500 mg total) by mouth 4 (four) times daily for 7 days. 11/15/22 11/22/22 Yes Anuj Summons, Acie Fredrickson, FNP  omeprazole (PRILOSEC) 20 MG capsule Take 1 capsule  (20 mg total) by mouth 2 (two) times daily before a meal. 06/18/22  Yes Newlin, Enobong, MD  Prenatal Vit-Fe Fumarate-FA (MULTIVITAMIN-PRENATAL) 27-0.8 MG TABS tablet Take 1 tablet by mouth daily at 12 noon.   Yes [provider]    Family History Family History  Problem Relation Age of Onset   Diabetes Mother    Hypertension Mother    Heart disease Father    Heart disease Maternal Grandmother    Cancer Paternal Grandmother     Social History Social History   Tobacco Use   Smoking status: Never   Smokeless tobacco: Never  Vaping Use   Vaping status: Never Used  Substance Use Topics   Alcohol use: Never   Drug use: Never     Allergies   Patient has no known allergies.   Review of Systems Review of Systems Per HPI  Physical Exam Triage Vital Signs ED Triage Vitals [11/15/22 1150]  Encounter Vitals Group     BP 111/73     Systolic BP Percentile      Diastolic BP Percentile      Pulse Rate 76     Resp 18  Temp 98.8 F (37.1 C)     Temp Source Oral     SpO2 97 %     Weight      Height      Head Circumference      Peak Flow      Pain Score      Pain Loc      Pain Education      Exclude from Growth Chart    No data found.  Updated Vital Signs BP 111/73 (BP Location: Left Arm)   Pulse 76   Temp 98.8 F (37.1 C) (Oral)   Resp 18   LMP 11/04/2022   SpO2 97%   Breastfeeding Yes   Visual Acuity Right Eye Distance:   Left Eye Distance:   Bilateral Distance:    Right Eye Near:   Left Eye Near:    Bilateral Near:     Physical Exam Exam conducted with a chaperone present.  Constitutional:      General: She is not in acute distress.    Appearance: Normal appearance. She is not toxic-appearing or diaphoretic.  HENT:     Head: Normocephalic and atraumatic.  Eyes:     Extraocular Movements: Extraocular movements intact.     Conjunctiva/sclera: Conjunctivae normal.  Pulmonary:     Effort: Pulmonary effort is normal.  Chest:        Comments: Patient states that there is tenderness to palpation present to circled area on diagram of right breast.  No obvious masses or lumps noted.  No erythema or discoloration.  Nipple appears normal. Neurological:     General: No focal deficit present.     Mental Status: She is alert and oriented to person, place, and time. Mental status is at baseline.  Psychiatric:        Mood and Affect: Mood normal.        Behavior: Behavior normal.        Thought Content: Thought content normal.        Judgment: Judgment normal.      UC Treatments / Results  Labs (all labs ordered are listed, but only abnormal results are displayed) Labs Reviewed - No data to display  EKG   Radiology No results found.  Procedures Procedures (including critical care time)  Medications Ordered in UC Medications - No data to display  Initial Impression / Assessment and Plan / UC Course  I have reviewed the triage vital signs and the nursing notes.  Pertinent labs & imaging results that were available during my care of the patient were reviewed by me and considered in my medical decision making (see chart for details).     I am concerned for lactational mastitis.  Will treat with cephalexin.  Advised patient to continue to breast-feed on the affected side and follow-up with OB/GYN if symptoms persist or worsen.  Do not think imaging is necessary at this time but discussed with patient that it may be necessary if symptoms are not improving with current treatment regimen.  Patient verbalized understanding and was agreeable with plan.  Interpreter used throughout patient interaction. Final Clinical Impressions(s) / UC Diagnoses   Final diagnoses:  Mastitis     Discharge Instructions      I have prescribed an antibiotic to treat possible mastitis which is an infection of the breast.  Follow-up with your OB/GYN if symptoms persist or worsen.    ED Prescriptions     Medication Sig Dispense Auth.  Provider   cephALEXin [facility])  500 MG capsule Take 1 capsule (500 mg total) by mouth 4 (four) times daily for 7 days. 28 capsule [place], Acie Fredrickson, [place]      PDMP not reviewed this encounter.   Gustavus Bryant, [place] 11/15/22 1234

## 2022-11-15 NOTE — Discharge Instructions (Signed)
I have prescribed an antibiotic to treat possible mastitis which is an infection of the breast.  Follow-up with your OB/GYN if symptoms persist or worsen.

## 2022-12-13 ENCOUNTER — Encounter: Payer: Self-pay | Admitting: Family Medicine

## 2022-12-13 ENCOUNTER — Ambulatory Visit: Payer: Medicaid Other | Attending: Family Medicine | Admitting: Family Medicine

## 2022-12-13 VITALS — BP 108/73 | HR 70 | Ht 63.0 in | Wt 170.0 lb

## 2022-12-13 DIAGNOSIS — Z23 Encounter for immunization: Secondary | ICD-10-CM | POA: Diagnosis not present

## 2022-12-13 DIAGNOSIS — R42 Dizziness and giddiness: Secondary | ICD-10-CM | POA: Diagnosis not present

## 2022-12-13 DIAGNOSIS — R202 Paresthesia of skin: Secondary | ICD-10-CM

## 2022-12-13 DIAGNOSIS — R7303 Prediabetes: Secondary | ICD-10-CM | POA: Diagnosis not present

## 2022-12-13 LAB — POCT GLYCOSYLATED HEMOGLOBIN (HGB A1C): HbA1c, POC (prediabetic range): 5.8 % (ref 5.7–6.4)

## 2022-12-13 NOTE — Patient Instructions (Signed)
Mareos Dizziness Los mareos son un problema muy frecuente. Causan sensacin de inestabilidad o de desvanecimiento. Puede sentir que se va a desmayar. Los [employer] pueden provocarle una lesin si se tropieza o se cae. Es ms frecuente sentirse mareado si es un adulto mayor. Hay muchas cosas que pueden hacer que se sienta mareado. Esto incluye lo siguiente: Medicamentos. Deshidratacin. Esto ocurre cuando no hay suficiente agua en el cuerpo. Enfermedad. Siga estas instrucciones en su casa: Comida y bebida  Beber suficiente lquido para mantener el pis (orina) de color amarillo plido. Esto evita la deshidratacin. Trate de beber ms lquidos transparentes, como agua. No beba alcohol. Trate de limitar la cantidad de cafena que consume. Trate de limitar la cantidad de sal, tambin llamada sodio, que consume. Actividad Trate de no hacer movimientos rpidos. Prese lentamente despus de estar sentado en una silla. Sostngase hasta que se sienta bien. Por la maana, sintese primero a un lado de la cama. Cuando se sienta bien, sostngase de algo y pngase lentamente de pie. Haga esto hasta que se sienta seguro en cuanto al equilibrio. Mueva las piernas con frecuencia si debe estar de pie en un lugar durante mucho tiempo. Mientras est de pie, contraiga y relaje los msculos de las piernas. No conduzca ni use mquinas si se siente mareado. Evite agacharse si se siente mareado. En su casa, coloque los objetos en algn lugar que pueda alcanzarlos sin agacharse. Estilo de vida No fume ni use vapeadores o productos que tengan nicotina o tabaco. Si necesita ayuda para dejar de fumar, hable con el mdico. Intente bajar el nivel de estrs. Puede hacerlo usando mtodos como el yoga o la meditacin. Hable con el mdico si necesita ayuda. Instrucciones generales Controle sus mareos para ver si hay cambios. Use los medicamentos solamente como se lo haya indicado el mdico. Hable con el mdico si cree que la  causa de sus mareos es algn medicamento que est tomando. Dgale a un amigo o a un familiar si se siente mareado. Si detectan algn cambio en su comportamiento, pdales que llamen a su mdico. Comunquese con un mdico si: Los mareos no desaparecen o tiene sntomas nuevos. Su mareo empeora. Siente que va a vomitar. Tiene problemas para escuchar. Tiene fiebre. Tiene dolor o rigidez en el cuello. Se cae o se lastima. Solicite ayuda de inmediato si: Vomita cada vez que come o bebe. Tiene heces acuosas y no puede comer ni beber. Tiene problemas para hablar, caminar, tragar o [employer], las manos o las piernas. Se siente muy dbil. Est sangrando. No piensa con claridad o tiene problemas para armar oraciones. Un amigo o un familiar pueden notar que esto ocurre. Le cambia la visin o tiene un dolor de cabeza muy intenso. Estos sntomas pueden Customer service manager. Llame al 911 de inmediato. No espere a ver si los sntomas desaparecen. No conduzca por sus propios medios [employer]. Esta informacin no tiene Theme park manager el consejo del mdico. Asegrese de hacerle al mdico cualquier pregunta que tenga. Document Revised: 06/28/2022 Document Reviewed: 06/28/2022 Elsevier Patient Education  2024 [employer].

## 2022-12-13 NOTE — Progress Notes (Signed)
Subjective:  Patient ID: Molly Carr, female    DOB: 1991/01/21  Age: 32 y.o. MRN: [id number]  CC: Prediabetes (Numbness in hands)   HPI Molly Carr is a 32 y.o. year old female with a history of prediabetes, history of Helicobacter pylori gastritis (treated with confirmed eradication) here for an office visit.  Interval History: Discussed the use of AI scribe software for clinical note transcription with the patient, who gave verbal consent to proceed.  She presents with concerns about numbness in her hands during sleep and occasional dizziness. The numbness is described as affecting both hands and is more pronounced when the patient sleeps on her side.  Symptoms are absent at rest and when she is not sleeping. The patient also reports occasional dizziness, which she describes as a feeling of weakness and a sensation of her head spinning. These episodes of dizziness occur both when the patient is active and when she is at rest.  She is unsure if this occurs with change of position.   The patient's gastritis has been well-managed with omeprazole which she is still taking.  With regards to her prediabetes, this is diet controlled and her A1c is 5.8.   Past Medical History:  Diagnosis Date   Acid reflux    Gastritis    Gestational diabetes    History of gestational diabetes 04/11/2020   Pregnancy induced hypertension     Past Surgical History:  Procedure Laterality Date   CESAREAN SECTION N/A 01/21/2022   Procedure: CESAREAN SECTION;  Surgeon: Myna Hidalgo, DO;  [facility]: [facility];  Service: Obstetrics;  Laterality: N/A;   EXTERNAL CEPHALIC VERSION  01/21/2022   INTRAUTERINE DEVICE (IUD) INSERTION N/A 01/21/2022   Procedure: INTRAUTERINE DEVICE (IUD) INSERTION;  Surgeon: Myna Hidalgo, DO;  Location: [facility];  Service: Obstetrics;  Laterality: N/A;   NO PAST SURGERIES      Family History  Problem Relation Age of Onset   Diabetes  Mother    Hypertension Mother    Heart disease Father    Heart disease Maternal Grandmother    Cancer Paternal Grandmother     Social History   Socioeconomic History   Marital status: Married    Spouse name: Not on file   Number of children: 2   Years of education: Not on file   Highest education level: Not on file  Occupational History   Not on file  Tobacco Use   Smoking status: Never   Smokeless tobacco: Never  Vaping Use   Vaping status: Never Used  Substance and Sexual Activity   Alcohol use: Never   Drug use: Never   Sexual activity: Yes    Birth control/protection: I.U.D.  Other Topics Concern   Not on file  Social History Narrative   Not on file   Social Determinants of Health   Financial Resource Strain: Not on file  Food Insecurity: No Food Insecurity (01/21/2022)   Hunger Vital Sign    Worried About Running Out of Food in the Last Year: Never true    Ran Out of Food in the Last Year: Never true  Transportation Needs: No Transportation Needs (01/21/2022)   PRAPARE - Administrator, Civil Service (Medical): No    Lack of Transportation (Non-Medical): No  Physical Activity: Not on file  Stress: Not on file  Social Connections: Not on file    No Known Allergies  Outpatient Medications Prior to Visit  Medication Sig  Dispense Refill   omeprazole (PRILOSEC) 20 MG capsule Take 1 capsule (20 mg total) by mouth 2 (two) times daily before a meal. 28 capsule 0   Prenatal Vit-Fe Fumarate-FA (MULTIVITAMIN-PRENATAL) 27-0.8 MG TABS tablet Take 1 tablet by mouth daily at 12 noon.     No facility-administered medications prior to visit.     ROS Review of Systems  Constitutional:  Negative for activity change and appetite change.  HENT:  Negative for sinus pressure and sore throat.   Respiratory:  Negative for chest tightness, shortness of breath and wheezing.   Cardiovascular:  Negative for chest pain and palpitations.  Gastrointestinal:  Negative  for abdominal distention, abdominal pain and constipation.  Genitourinary: Negative.   Musculoskeletal: Negative.   Neurological:  Positive for dizziness and numbness.  Psychiatric/Behavioral:  Negative for behavioral problems and dysphoric mood.     Objective:  BP 108/73   Pulse 70   Ht 5\' 3"  (1.6 m)   Wt 170 lb (77.1 kg)   LMP 11/04/2022   SpO2 97%   BMI 30.11 kg/m      12/13/2022   10:44 AM 11/15/2022   11:50 AM 08/03/2022   11:02 AM  BP/Weight  Systolic BP 108 111 104  Diastolic BP 73 73 69  Wt. (Lbs) 170  174  BMI 30.11 kg/m2  30.82 kg/m2      Physical Exam Constitutional:      Appearance: She is well-developed.  Cardiovascular:     Rate and Rhythm: Normal rate.     Heart sounds: Normal heart sounds. No murmur heard. Pulmonary:     Effort: Pulmonary effort is normal.     Breath sounds: Normal breath sounds. No wheezing or rales.  Chest:     Chest wall: No tenderness.  Abdominal:     General: Bowel sounds are normal. There is no distension.     Palpations: Abdomen is soft. There is no mass.     Tenderness: There is no abdominal tenderness.  Musculoskeletal:     Right lower leg: No edema.     Left lower leg: No edema.     Comments: Negative Tinel and Phalen signs  Neurological:     Mental Status: She is alert and oriented to person, place, and time.  Psychiatric:        Mood and Affect: Mood normal.        Latest Ref Rng & Units 06/15/2022    9:44 AM 06/24/2021    9:20 PM 02/28/2020    9:56 PM  CMP  Glucose 70 - 99 mg/dL 86  413  244   BUN 6 - 20 mg/dL 11  11  10    Creatinine 0.57 - 1.00 mg/dL 0.10  2.72  5.36   Sodium 134 - 144 mmol/L 137  135  136   Potassium 3.5 - 5.2 mmol/L 4.4  3.9  3.9   Chloride 96 - 106 mmol/L 102  104  107   CO2 20 - 29 mmol/L 21  23  19    Calcium 8.7 - 10.2 mg/dL 9.5  9.2  9.2   Total Protein 6.0 - 8.5 g/dL 7.5  7.2  6.3   Total Bilirubin 0.0 - 1.2 mg/dL 0.3  0.7  0.4   Alkaline Phos 44 - 121 IU/L 92  63  91   AST 0 -  40 IU/L 15  14  17    ALT 0 - 32 IU/L 17  23  13      Lipid Panel  Component Value Date/Time   CHOL 201 (H) 06/15/2022 0944   TRIG 207 (H) 06/15/2022 0944   HDL 48 06/15/2022 0944   LDLCALC 117 (H) 06/15/2022 0944    CBC    Component Value Date/Time   WBC 6.4 06/15/2022 0944   WBC 12.9 (H) 01/22/2022 0605   RBC 4.37 06/15/2022 0944   RBC 3.48 (L) 01/22/2022 0605   HGB 12.2 06/15/2022 0944   HCT 37.3 06/15/2022 0944   PLT 389 06/15/2022 0944   MCV 85 06/15/2022 0944   MCH 27.9 06/15/2022 0944   MCH 29.0 01/22/2022 0605   MCHC 32.7 06/15/2022 0944   MCHC 34.5 01/22/2022 0605   RDW 14.1 06/15/2022 0944   LYMPHSABS 2.0 06/15/2022 0944   MONOABS 0.6 06/24/2021 2120   EOSABS 0.1 06/15/2022 0944   BASOSABS 0.0 06/15/2022 0944    Lab Results  Component Value Date   HGBA1C 5.8 12/13/2022    Assessment & Plan:      Gastritis History of H. pylori gastritis treated with confirmed eradication in 07/2022 Improvement with omeprazole.  Prediabetes -Controlled on diet with A1c of 5.8 -Continue current management and work on lifestyle changes to prevent progression to type 2 diabetes mellitus  Paresthesia Reports of nocturnal numbness in hands and occasional numbness in legs when bent. Possible carpal tunnel syndrome or vitamin B12 deficiency. -Check vitamin B12 levels. -Recommend use of wrist braces during sleep.  Dizziness Reports of intermittent dizziness and weakness.  Nonspecific symptoms -Check hemoglobin levels.  General Health Maintenance -Administer influenza vaccine today.          No orders of the defined types were placed in this encounter.   Follow-up: Return in about 1 year (around 12/13/2023) for Chronic medical conditions.       Hoy Register, MD, FAAFP. [facility] [place], [place] [phone]   12/13/2022, 11:53 AM

## 2022-12-14 LAB — CMP14+EGFR
ALT: 15 IU/L (ref 0–32)
AST: 14 IU/L (ref 0–40)
Albumin: 4.8 g/dL (ref 3.9–4.9)
Alkaline Phosphatase: 95 IU/L (ref 44–121)
BUN/Creatinine Ratio: 27 — ABNORMAL HIGH (ref 9–23)
BUN: 14 mg/dL (ref 6–20)
Bilirubin Total: 0.4 mg/dL (ref 0.0–1.2)
CO2: 22 mmol/L (ref 20–29)
Calcium: 9.9 mg/dL (ref 8.7–10.2)
Chloride: 100 mmol/L (ref 96–106)
Creatinine, Ser: 0.52 mg/dL — ABNORMAL LOW (ref 0.57–1.00)
Globulin, Total: 3.1 g/dL (ref 1.5–4.5)
Glucose: 85 mg/dL (ref 70–99)
Potassium: 4.7 mmol/L (ref 3.5–5.2)
Sodium: 137 mmol/L (ref 134–144)
Total Protein: 7.9 g/dL (ref 6.0–8.5)
eGFR: 126 mL/min/{1.73_m2} (ref >59)

## 2022-12-14 LAB — VITAMIN B12: Vitamin B-12: 598 pg/mL (ref 232–1245)

## 2022-12-14 LAB — CBC WITH DIFFERENTIAL/PLATELET
Basophils Absolute: 0 10*3/uL (ref 0.0–0.2)
Basos: 1 %
EOS (ABSOLUTE): 0.1 10*3/uL (ref 0.0–0.4)
Eos: 1 %
Hematocrit: 39.7 % (ref 34.0–46.6)
Hemoglobin: 13 g/dL (ref 11.1–15.9)
Immature Grans (Abs): 0 10*3/uL (ref 0.0–0.1)
Immature Granulocytes: 0 %
Lymphocytes Absolute: 2.2 10*3/uL (ref 0.7–3.1)
Lymphs: 31 %
MCH: 28.3 pg (ref 26.6–33.0)
MCHC: 32.7 g/dL (ref 31.5–35.7)
MCV: 87 fL (ref 79–97)
Monocytes Absolute: 0.4 10*3/uL (ref 0.1–0.9)
Monocytes: 6 %
Neutrophils Absolute: 4.3 10*3/uL (ref 1.4–7.0)
Neutrophils: 61 %
Platelets: 372 10*3/uL (ref 150–450)
RBC: 4.59 x10E6/uL (ref 3.77–5.28)
RDW: 13.7 % (ref 11.7–15.4)
WBC: 7 10*3/uL (ref 3.4–10.8)

## 2022-12-14 LAB — LP+NON-HDL CHOLESTEROL
Cholesterol, Total: 204 mg/dL — ABNORMAL HIGH (ref 100–199)
HDL: 54 mg/dL (ref >39)
LDL Chol Calc (NIH): 128 mg/dL — ABNORMAL HIGH (ref 0–99)
Total Non-HDL-Chol (LDL+VLDL): 150 mg/dL — ABNORMAL HIGH (ref 0–129)
Triglycerides: 121 mg/dL (ref 0–149)
VLDL Cholesterol Cal: 22 mg/dL (ref 5–40)

## 2022-12-18 ENCOUNTER — Ambulatory Visit: Payer: Medicaid Other | Admitting: Obstetrics and Gynecology

## 2023-02-18 ENCOUNTER — Other Ambulatory Visit: Payer: Self-pay | Admitting: Family Medicine

## 2023-02-18 ENCOUNTER — Encounter: Payer: Self-pay | Admitting: Family Medicine

## 2023-02-18 DIAGNOSIS — L84 Corns and callosities: Secondary | ICD-10-CM

## 2023-02-20 ENCOUNTER — Ambulatory Visit: Payer: Medicaid Other | Admitting: Physician Assistant

## 2023-03-11 ENCOUNTER — Encounter: Payer: Self-pay | Admitting: Podiatry

## 2023-03-11 ENCOUNTER — Ambulatory Visit: Payer: Medicaid Other | Admitting: Podiatry

## 2023-03-11 DIAGNOSIS — D2371 Other benign neoplasm of skin of right lower limb, including hip: Secondary | ICD-10-CM | POA: Diagnosis not present

## 2023-03-11 NOTE — Progress Notes (Signed)
   Chief Complaint  Patient presents with   Callouses    PATIENT STATES SHE HAS CORNS ON THE BOTTOM OF HER RIGHT FOOT , AND WHEN SHE PRESS ON IT IT IS PAINFUL . IT HAS BEEN LIKE THIS FOR ABOUT 4 MONTHS AGO , NO MEDICATION FOR PAIN AND NO CREAM FOR THE CORNS .    Subjective: 32 y.o. female presenting to the office today for evaluation of symptomatic skin lesion to the plantar aspect of the right forefoot.  Ongoing for several months.  Exacerbated by going barefoot.  Idiopathic onset.  Denies any history of injury.  She has not anything for treatment   Past Medical History:  Diagnosis Date   Acid reflux    Gastritis    Gestational diabetes    History of gestational diabetes 04/11/2020   Pregnancy induced hypertension     Past Surgical History:  Procedure Laterality Date   CESAREAN SECTION N/A 01/21/2022   Procedure: CESAREAN SECTION;  Surgeon: Myna Hidalgo, DO;  [facility];  Service: Obstetrics;  Laterality: N/A;   EXTERNAL CEPHALIC VERSION  01/21/2022   INTRAUTERINE DEVICE (IUD) INSERTION N/A 01/21/2022   Procedure: INTRAUTERINE DEVICE (IUD) INSERTION;  Surgeon: Myna Hidalgo, DO;  [facility];  Service: Obstetrics;  Laterality: N/A;   NO PAST SURGERIES      No Known Allergies   Objective:  Physical Exam General: Alert and oriented x3 in no acute distress  Dermatology: Hyperkeratotic lesion(s) present on the plantar aspect of the right forefoot. Pain on palpation with a central nucleated core noted. Skin is warm, dry and supple bilateral lower extremities. Negative for open lesions or macerations.  Vascular: Palpable pedal pulses bilaterally. No edema or erythema noted. Capillary refill within normal limits.  Neurological: Grossly intact via light touch  Musculoskeletal Exam: Pain on palpation at the keratotic lesion(s) noted. Range of motion within normal limits bilateral. Muscle strength 5/5 in all groups bilateral.  Assessment: 1.  Symptomatic  benign skin lesion   Plan of Care:  -Patient evaluated -Excisional debridement of keratoic lesion(s) using a chisel blade was performed without incident.  -Salicylic acid applied with a bandaid -Return to the clinic PRN.   Felecia Shelling, DPM [facility]  Dr. Felecia Shelling, DPM    [address] [place], [place] [address]                Office [phone]  Fax [phone]

## 2023-03-14 ENCOUNTER — Ambulatory Visit: Payer: Medicaid Other | Admitting: Family Medicine

## 2023-03-14 ENCOUNTER — Encounter: Payer: Self-pay | Admitting: Family Medicine

## 2023-03-14 ENCOUNTER — Other Ambulatory Visit (HOSPITAL_COMMUNITY)
Admission: RE | Admit: 2023-03-14 | Discharge: 2023-03-14 | Disposition: A | Discharge status: Home / Self Care | Payer: Medicaid Other | Source: Ambulatory Visit | Attending: Family Medicine | Admitting: Family Medicine

## 2023-03-14 VITALS — BP 124/72 | HR 81 | Ht 62.0 in | Wt 176.0 lb

## 2023-03-14 DIAGNOSIS — Z975 Presence of (intrauterine) contraceptive device: Secondary | ICD-10-CM

## 2023-03-14 DIAGNOSIS — N898 Other specified noninflammatory disorders of vagina: Secondary | ICD-10-CM | POA: Diagnosis present

## 2023-03-14 DIAGNOSIS — Z30431 Encounter for routine checking of intrauterine contraceptive device: Secondary | ICD-10-CM | POA: Diagnosis not present

## 2023-03-14 DIAGNOSIS — Z202 Contact with and (suspected) exposure to infections with a predominantly sexual mode of transmission: Secondary | ICD-10-CM | POA: Insufficient documentation

## 2023-03-14 DIAGNOSIS — Z603 Acculturation difficulty: Secondary | ICD-10-CM | POA: Diagnosis not present

## 2023-03-14 DIAGNOSIS — Z758 Other problems related to medical facilities and other health care: Secondary | ICD-10-CM

## 2023-03-14 NOTE — Progress Notes (Addendum)
   GYNECOLOGY OFFICE VISIT NOTE  History:   Molly Carr is a 32 y.o. [phone] here today for boil on outside of vaginal along with vaginal itching, burning, and odor after periods. She's request IUD check as well as sexually transmitted infection testing.     Past Medical History:  Diagnosis Date   Acid reflux    Gastritis    Gestational diabetes    History of gestational diabetes 04/11/2020   Pregnancy induced hypertension     Past Surgical History:  Procedure Laterality Date   CESAREAN SECTION N/A 01/21/2022   Procedure: CESAREAN SECTION;  Surgeon: Molly Hidalgo, DO;  [facility];  Service: Obstetrics;  Laterality: N/A;   EXTERNAL CEPHALIC VERSION  01/21/2022   INTRAUTERINE DEVICE (IUD) INSERTION N/A 01/21/2022   Procedure: INTRAUTERINE DEVICE (IUD) INSERTION;  Surgeon: Molly Hidalgo, DO;  [facility]: [facility];  Service: Obstetrics;  Laterality: N/A;   NO PAST SURGERIES      The following portions of the patient's history were reviewed and updated as appropriate: allergies, current medications, past family history, past medical history, past social history, past surgical history and problem list.   Health Maintenance:  Normal pap and negative HRHPV on 07/10/21.    Review of Systems:  Pertinent items noted in HPI and remainder of comprehensive ROS otherwise negative.  Physical Exam:  BP 124/72 (BP Location: Left Arm, Cuff Size: Normal)   Pulse 81   Ht 5\' 2"  (1.575 m)   Wt 176 lb (79.8 kg)   LMP 02/15/2023 (Exact Date)   BMI 32.19 kg/m  CONSTITUTIONAL: Well-developed, well-nourished female in no acute distress.  HEENT:  Normocephalic, atraumatic. External right and left ear normal. No scleral icterus.  NECK: Normal range of motion, supple, no masses noted on observation SKIN: No rash noted. Not diaphoretic. No erythema. No pallor. MUSCULOSKELETAL: Normal range of motion. No edema noted. NEUROLOGIC: Alert and oriented to person, place, and time.  Normal muscle tone coordination.  PSYCHIATRIC: Normal mood and affect. Normal behavior. Normal judgment and thought content. CARDIOVASCULAR: Normal heart rate noted RESPIRATORY: Effort and breath sounds normal, no problems with respiration noted ABDOMEN: No masses noted. No other overt distention noted.   PELVIC: Normal appearing external genitalia; normal urethral meatus; normal appearing vaginal mucosa and cervix.  No abnormal discharge noted.  White IUD strings from external os at appropriate length, no other portion of device noticed. Performed in the presence of a chaperone  Labs and Imaging No results found for this or any previous visit (from the past week). No results found.    Assessment and Plan:      1. Exposure to sexually transmitted disease (STD) (Primary) - HIV antibody (with reflex) - RPR - Hepatitis C Antibody - Cervicovaginal ancillary only( [place])  2. Vaginal irritation - HIV antibody (with reflex) - RPR - Hepatitis C Antibody - Cervicovaginal ancillary only( [place]) - Urine Culture  3. IUD (intrauterine device) in place  4. Language barrier   Used in person interpreter, spanish   Routine preventative health maintenance measures emphasized. Please refer to After Visit Summary for other counseling recommendations.   Return if symptoms worsen or fail to improve.     Molly Dibble, MD OB Fellow, Faculty Practice [facility], [facility] 03/14/2023 2:57 PM

## 2023-03-14 NOTE — Progress Notes (Signed)
Pt. Presents for IUD check. Pt. Has itching, burning, and odor after her periods. Pt. Complains of a boil on the outside of her vagina.

## 2023-03-14 NOTE — Addendum Note (Signed)
Addended by: Mittie Bodo on: 03/14/2023 02:57 PM   Modules accepted: Orders

## 2023-03-15 LAB — CERVICOVAGINAL ANCILLARY ONLY
Bacterial Vaginitis (gardnerella): NEGATIVE
Candida Glabrata: NEGATIVE
Candida Vaginitis: POSITIVE — AB
Chlamydia: NEGATIVE
Comment: NEGATIVE
Comment: NEGATIVE
Comment: NEGATIVE
Comment: NEGATIVE
Comment: NEGATIVE
Comment: NORMAL
Neisseria Gonorrhea: NEGATIVE
Trichomonas: NEGATIVE

## 2023-03-15 LAB — HIV ANTIBODY (ROUTINE TESTING W REFLEX): HIV Screen 4th Generation wRfx: NONREACTIVE

## 2023-03-15 LAB — HEPATITIS C ANTIBODY: Hep C Virus Ab: NONREACTIVE

## 2023-03-15 LAB — RPR: RPR Ser Ql: NONREACTIVE

## 2023-03-16 LAB — URINE CULTURE: Organism ID, Bacteria: NO GROWTH

## 2023-03-21 MED ORDER — FLUCONAZOLE 150 MG PO TABS: 150.0000 mg | ORAL_TABLET | Freq: Once | ORAL | 0 refills | Status: AC

## 2023-03-21 NOTE — Addendum Note (Signed)
Addended by: Mittie Bodo on: 03/21/2023 08:01 PM   Modules accepted: Orders

## 2023-05-14 DIAGNOSIS — R69 Illness, unspecified: Secondary | ICD-10-CM | POA: Insufficient documentation

## 2023-05-14 DIAGNOSIS — R5383 Other fatigue: Secondary | ICD-10-CM | POA: Insufficient documentation

## 2023-05-14 DIAGNOSIS — R519 Headache, unspecified: Secondary | ICD-10-CM | POA: Insufficient documentation

## 2023-05-14 DIAGNOSIS — Z975 Presence of (intrauterine) contraceptive device: Secondary | ICD-10-CM | POA: Insufficient documentation

## 2023-05-16 DIAGNOSIS — E559 Vitamin D deficiency, unspecified: Secondary | ICD-10-CM | POA: Insufficient documentation

## 2023-06-27 ENCOUNTER — Ambulatory Visit: Admission: EM | Admit: 2023-06-27 | Discharge: 2023-06-27 | Disposition: A | Discharge status: Home / Self Care

## 2023-06-27 ENCOUNTER — Encounter (HOSPITAL_BASED_OUTPATIENT_CLINIC_OR_DEPARTMENT_OTHER): Payer: Self-pay | Admitting: Internal Medicine

## 2023-06-27 ENCOUNTER — Encounter: Payer: Self-pay | Admitting: Emergency Medicine

## 2023-06-27 DIAGNOSIS — R112 Nausea with vomiting, unspecified: Secondary | ICD-10-CM

## 2023-06-27 DIAGNOSIS — R197 Diarrhea, unspecified: Secondary | ICD-10-CM

## 2023-06-27 DIAGNOSIS — B349 Viral infection, unspecified: Secondary | ICD-10-CM | POA: Diagnosis not present

## 2023-06-27 DIAGNOSIS — R5383 Other fatigue: Secondary | ICD-10-CM

## 2023-06-27 DIAGNOSIS — R0683 Snoring: Secondary | ICD-10-CM

## 2023-06-27 DIAGNOSIS — J351 Hypertrophy of tonsils: Secondary | ICD-10-CM | POA: Insufficient documentation

## 2023-06-27 NOTE — ED Triage Notes (Signed)
 Pt reports sharp abdominal pain and emesis that started today. Total of 6 emesis episodes, last one at noon. Denies diarrhea. Not currently nauseous. Pt took omeprazole and pepto earlier with some relief, but threw it up later. Has been able to keep some drink down since the last emesis episode. No fevers.

## 2023-06-27 NOTE — ED Provider Notes (Addendum)
 [facility]    CSN: [id number] Arrival date & time: 06/27/23  1627      History   Chief Complaint Chief Complaint  Patient presents with   Abdominal Pain   Emesis   Nausea    HPI Molly Carr is a 33 y.o. female.   Patient presents with nausea and vomiting that started today. Denies diarrhea. Having normal bowel movements. Her children have similar symptoms.  She has taken omeprazole and Pepto-Bismol today for symptoms.  She has been able to tolerate fluids a little bit today.  Denies any fever.  She is currently breast-feeding. Patient is urinating appropriately.    Abdominal Pain Emesis   Past Medical History:  Diagnosis Date   Acid reflux    Gastritis    Gestational diabetes    History of gestational diabetes 04/11/2020   Pregnancy induced hypertension     Patient Active Problem List   Diagnosis Date Noted   Enlarged tonsils 06/27/2023   Vitamin D deficiency 05/16/2023   Disease suspected 05/14/2023   Fatigue 05/14/2023   Frequent headaches 05/14/2023   Intrauterine contraceptive device 05/14/2023   Helicobacter pylori gastritis 06/18/2022   IUD (intrauterine device) in place 01/22/2022   Language barrier 11/29/2021    Past Surgical History:  Procedure Laterality Date   CESAREAN SECTION N/A 01/21/2022   Procedure: CESAREAN SECTION;  Surgeon: Myna Hidalgo, DO;  [facility];  Service: Obstetrics;  Laterality: N/A;   EXTERNAL CEPHALIC VERSION  01/21/2022   INTRAUTERINE DEVICE (IUD) INSERTION N/A 01/21/2022   Procedure: INTRAUTERINE DEVICE (IUD) INSERTION;  Surgeon: Myna Hidalgo, DO;  [facility];  Service: Obstetrics;  Laterality: N/A;   NO PAST SURGERIES      OB History     Gravida  3   Para  3   Term  3   Preterm      AB      Living  3      SAB      IAB      Ectopic      Multiple  0   Live Births  3            Home Medications    Prior to Admission medications    Medication Sig Start Date End Date Taking? Authorizing Provider  omeprazole (PRILOSEC) 20 MG capsule Take 1 capsule (20 mg total) by mouth 2 (two) times daily before a meal. 06/18/22  Yes Newlin, Enobong, MD  Prenatal Vit-Fe Fumarate-FA (MULTIVITAMIN-PRENATAL) 27-0.8 MG TABS tablet Take 1 tablet by mouth daily at 12 noon. Patient not taking: Reported on 03/14/2023    [provider]    Family History Family History  Problem Relation Age of Onset   Diabetes Mother    Hypertension Mother    Heart disease Father    Heart disease Maternal Grandmother    Cancer Paternal Grandmother     Social History Social History   Tobacco Use   Smoking status: Never   Smokeless tobacco: Never  Vaping Use   Vaping status: Never Used  Substance Use Topics   Alcohol use: Never   Drug use: Never     Allergies   Patient has no known allergies.   Review of Systems Review of Systems Per HPI  Physical Exam Triage Vital Signs ED Triage Vitals  Encounter Vitals Group     BP 06/27/23 1856 112/74     Systolic BP Percentile --      Diastolic  BP Percentile --      Pulse Rate 06/27/23 1856 91     Resp 06/27/23 1856 16     Temp 06/27/23 1856 97.9 F (36.6 C)     Temp Source 06/27/23 1856 Oral     SpO2 06/27/23 1856 97 %     Weight --      Height --      Head Circumference --      Peak Flow --      Pain Score 06/27/23 1857 5     Pain Loc --      Pain Education --      Exclude from Growth Chart --    No data found.  Updated Vital Signs BP 112/74 (BP Location: Left Arm)   Pulse 91   Temp 97.9 F (36.6 C) (Oral)   Resp 16   LMP  (LMP Unknown)   SpO2 97%   Breastfeeding Yes   Visual Acuity Right Eye Distance:   Left Eye Distance:   Bilateral Distance:    Right Eye Near:   Left Eye Near:    Bilateral Near:     Physical Exam Constitutional:      General: She is not in acute distress.    Appearance: Normal appearance. She is not toxic-appearing or diaphoretic.   HENT:     Head: Normocephalic and atraumatic.     Mouth/Throat:     Mouth: Mucous membranes are moist.     Pharynx: No posterior oropharyngeal erythema.  Eyes:     Extraocular Movements: Extraocular movements intact.     Conjunctiva/sclera: Conjunctivae normal.  Cardiovascular:     Rate and Rhythm: Normal rate and regular rhythm.     Pulses: Normal pulses.     Heart sounds: Normal heart sounds.  Pulmonary:     Effort: Pulmonary effort is normal. No respiratory distress.     Breath sounds: Normal breath sounds.  Abdominal:     General: Bowel sounds are normal. There is no distension.     Palpations: Abdomen is soft.     Tenderness: There is no abdominal tenderness.  Neurological:     General: No focal deficit present.     Mental Status: She is alert and oriented to person, place, and time. Mental status is at baseline.  Psychiatric:        Mood and Affect: Mood normal.        Behavior: Behavior normal.        Thought Content: Thought content normal.        Judgment: Judgment normal.      UC Treatments / Results  Labs (all labs ordered are listed, but only abnormal results are displayed) Labs Reviewed - No data to display  EKG   Radiology No results found.  Procedures Procedures (including critical care time)  Medications Ordered in UC Medications - No data to display  Initial Impression / Assessment and Plan / UC Course  I have reviewed the triage vital signs and the nursing notes.  Pertinent labs & imaging results that were available during my care of the patient were reviewed by me and considered in my medical decision making (see chart for details).     Suspect viral cause of symptoms given known sick contacts.  There are no signs of acute abdomen or dehydration on exam at this time.  Will defer ondansetron given patient is currently breast-feeding.  Advised adequate fluids, rest, bland diet.  Advised strict return and ER precautions.  Patient verbalized  understanding and was agreeable with plan.  Interpreter used throughout patient interaction. Final Clinical Impressions(s) / UC Diagnoses   Final diagnoses:  Nausea vomiting and diarrhea  Viral illness   Discharge Instructions   None    ED Prescriptions   None    PDMP not reviewed this encounter.   Gustavus Bryant, [place] 06/27/23 1945    Gustavus Bryant, [place] 06/27/23 1945

## 2023-11-05 NOTE — Procedures (Signed)
 Orders only

## 2023-12-17 ENCOUNTER — Ambulatory Visit: Payer: Medicaid Other | Admitting: Family Medicine

## 2024-01-15 ENCOUNTER — Ambulatory Visit: Admitting: Family Medicine

## 2024-02-11 ENCOUNTER — Ambulatory Visit: Attending: Family Medicine | Admitting: Family Medicine

## 2024-02-11 ENCOUNTER — Encounter: Payer: Self-pay | Admitting: Family Medicine

## 2024-02-11 VITALS — BP 105/65 | HR 79 | Temp 98.1°F | Ht 62.0 in | Wt 183.6 lb

## 2024-02-11 DIAGNOSIS — R7303 Prediabetes: Secondary | ICD-10-CM | POA: Diagnosis not present

## 2024-02-11 DIAGNOSIS — Z130 Encounter for screening for diseases of the blood and blood-forming organs and certain disorders involving the immune mechanism: Secondary | ICD-10-CM

## 2024-02-11 DIAGNOSIS — Z13220 Encounter for screening for lipoid disorders: Secondary | ICD-10-CM

## 2024-02-11 DIAGNOSIS — Z23 Encounter for immunization: Secondary | ICD-10-CM | POA: Diagnosis not present

## 2024-02-11 DIAGNOSIS — G44209 Tension-type headache, unspecified, not intractable: Secondary | ICD-10-CM

## 2024-02-11 DIAGNOSIS — Z0001 Encounter for general adult medical examination with abnormal findings: Secondary | ICD-10-CM | POA: Diagnosis not present

## 2024-02-11 DIAGNOSIS — Z13228 Encounter for screening for other metabolic disorders: Secondary | ICD-10-CM

## 2024-02-11 LAB — POCT GLYCOSYLATED HEMOGLOBIN (HGB A1C): HbA1c, POC (prediabetic range): 5.8 % (ref 5.7–6.4)

## 2024-02-11 NOTE — Progress Notes (Signed)
 Subjective:  Patient ID: Molly Carr, female    DOB: 22-Jun-1990  Age: 33 y.o. MRN: [id number]  CC: Annual Exam (headaches)     Discussed the use of AI scribe software for clinical note transcription with the patient, who gave verbal consent to proceed.  History of Present Illness Molly Carr is a 33 year old female who presents for her annual physical exam. She engages in physical activity about three times a week, walking for 15 to 30 minutes. Her diet includes an adequate intake of fruits and vegetables. Her A1c remains at 5.8%, consistent with last year. She experiences moderate headaches for the past four to five months, occurring once or twice a week, often located behind the eyes and alternating sides. These headaches are sometimes associated with stress and relieved by Tylenol  or ibuprofen  800 mg.      Past Medical History:  Diagnosis Date   Acid reflux    Gastritis    Gestational diabetes    History of gestational diabetes 04/11/2020   Pregnancy induced hypertension     Past Surgical History:  Procedure Laterality Date   CESAREAN SECTION N/A 01/21/2022   Procedure: CESAREAN SECTION;  Surgeon: Ozan, Jennifer, DO;  Location: [facility];  Service: Obstetrics;  Laterality: N/A;   EXTERNAL CEPHALIC VERSION  01/21/2022   INTRAUTERINE DEVICE (IUD) INSERTION N/A 01/21/2022   Procedure: INTRAUTERINE DEVICE (IUD) INSERTION;  Surgeon: Ozan, Jennifer, DO;  Location: [facility];  Service: Obstetrics;  Laterality: N/A;   NO PAST SURGERIES      Family History  Problem Relation Age of Onset   Diabetes Mother    Hypertension Mother    Heart disease Father    Heart disease Maternal Grandmother    Cancer Paternal Grandmother     Social History   Socioeconomic History   Marital status: Married    Spouse name: Not on file   Number of children: 2   Years of education: Not on file   Highest education level: Not on file  Occupational  History   Not on file  Tobacco Use   Smoking status: Never   Smokeless tobacco: Never  Vaping Use   Vaping status: Never Used  Substance and Sexual Activity   Alcohol use: Never   Drug use: Never   Sexual activity: Yes    Birth control/protection: I.U.D.  Other Topics Concern   Not on file  Social History Narrative   Not on file   Social Drivers of Health   Financial Resource Strain: Not on file  Food Insecurity: No Food Insecurity (01/21/2022)   Hunger Vital Sign    Worried About Running Out of Food in the Last Year: Never true    Ran Out of Food in the Last Year: Never true  Transportation Needs: No Transportation Needs (01/21/2022)   PRAPARE - Administrator, Civil Service (Medical): No    Lack of Transportation (Non-Medical): No  Physical Activity: Not on file  Stress: Not on file  Social Connections: Not on file    No Known Allergies  Outpatient Medications Prior to Visit  Medication Sig Dispense Refill   omeprazole  (PRILOSEC) 20 MG capsule Take 1 capsule (20 mg total) by mouth 2 (two) times daily before a meal. 28 capsule 0   Prenatal Vit-Fe Fumarate-FA (MULTIVITAMIN-PRENATAL) 27-0.8 MG TABS tablet Take 1 tablet by mouth daily at 12 noon. (Patient not taking: Reported on 02/11/2024)     No facility-administered  medications prior to visit.     ROS Review of Systems  Constitutional:  Negative for activity change and appetite change.  HENT:  Negative for sinus pressure and sore throat.   Respiratory:  Negative for chest tightness, shortness of breath and wheezing.   Cardiovascular:  Negative for chest pain and palpitations.  Gastrointestinal:  Negative for abdominal distention, abdominal pain and constipation.  Genitourinary: Negative.   Musculoskeletal: Negative.   Psychiatric/Behavioral:  Negative for behavioral problems and dysphoric mood.     Objective:  BP 105/65   Pulse 79   Temp 98.1 F (36.7 C) (Oral)   Ht 5' 2 (1.575 m)   Wt 183 lb  9.6 oz (83.3 kg)   SpO2 97%   BMI 33.58 kg/m      02/11/2024    2:22 PM 06/27/2023    6:56 PM 03/14/2023    1:53 PM  BP/Weight  Systolic BP 105 112 124  Diastolic BP 65 74 72  Wt. (Lbs) 183.6  176  BMI 33.58 kg/m2  32.19 kg/m2      Physical Exam Constitutional:      General: She is not in acute distress.    Appearance: She is well-developed. She is not diaphoretic.  HENT:     Head: Normocephalic.     Right Ear: External ear normal.     Left Ear: External ear normal.     Nose: Nose normal.     Mouth/Throat:     Mouth: Mucous membranes are moist.  Eyes:     Extraocular Movements: Extraocular movements intact.     Conjunctiva/sclera: Conjunctivae normal.     Pupils: Pupils are equal, round, and reactive to light.  Neck:     Vascular: No JVD.  Cardiovascular:     Rate and Rhythm: Normal rate and regular rhythm.     Pulses: Normal pulses.     Heart sounds: Normal heart sounds. No murmur heard.    No gallop.  Pulmonary:     Effort: Pulmonary effort is normal. No respiratory distress.     Breath sounds: Normal breath sounds. No wheezing or rales.  Chest:     Chest wall: No tenderness.  Abdominal:     General: Bowel sounds are normal. There is no distension.     Palpations: Abdomen is soft. There is no mass.     Tenderness: There is no abdominal tenderness.  Musculoskeletal:        General: No tenderness. Normal range of motion.     Cervical back: Normal range of motion.     Right lower leg: No edema.     Left lower leg: No edema.  Skin:    General: Skin is warm and dry.  Neurological:     Mental Status: She is alert and oriented to person, place, and time.     Deep Tendon Reflexes: Reflexes are normal and symmetric.  Psychiatric:        Mood and Affect: Mood normal.        Latest Ref Rng & Units 12/13/2022   11:18 AM 06/15/2022    9:44 AM 06/24/2021    9:20 PM  CMP  Glucose 70 - 99 mg/dL 85  86  886   BUN 6 - 20 mg/dL 14  11  11    Creatinine 0.57 - 1.00  mg/dL 9.47  9.57  9.46   Sodium 134 - 144 mmol/L 137  137  135   Potassium 3.5 - 5.2 mmol/L 4.7  4.4  3.9  Chloride 96 - 106 mmol/L 100  102  104   CO2 20 - 29 mmol/L 22  21  23    Calcium 8.7 - 10.2 mg/dL 9.9  9.5  9.2   Total Protein 6.0 - 8.5 g/dL 7.9  7.5  7.2   Total Bilirubin 0.0 - 1.2 mg/dL 0.4  0.3  0.7   Alkaline Phos 44 - 121 IU/L 95  92  63   AST 0 - 40 IU/L 14  15  14    ALT 0 - 32 IU/L 15  17  23      Lipid Panel     Component Value Date/Time   CHOL 204 (H) 12/13/2022 1124   TRIG 121 12/13/2022 1124   HDL 54 12/13/2022 1124   LDLCALC 128 (H) 12/13/2022 1124    CBC    Component Value Date/Time   WBC 7.0 12/13/2022 1118   WBC 12.9 (H) 01/22/2022 0605   RBC 4.59 12/13/2022 1118   RBC 3.48 (L) 01/22/2022 0605   HGB 13.0 12/13/2022 1118   HCT 39.7 12/13/2022 1118   PLT 372 12/13/2022 1118   MCV 87 12/13/2022 1118   MCH 28.3 12/13/2022 1118   MCH 29.0 01/22/2022 0605   MCHC 32.7 12/13/2022 1118   MCHC 34.5 01/22/2022 0605   RDW 13.7 12/13/2022 1118   LYMPHSABS 2.2 12/13/2022 1118   MONOABS 0.6 06/24/2021 2120   EOSABS 0.1 12/13/2022 1118   BASOSABS 0.0 12/13/2022 1118    Lab Results  Component Value Date   HGBA1C 5.8 02/11/2024    Lab Results  Component Value Date   HGBA1C 5.8 02/11/2024   HGBA1C 5.8 12/13/2022   HGBA1C 5.8 06/14/2022      1. Annual visit for general adult medical examination with abnormal findings (Primary) Counseled on 150 minutes of exercise per week, healthy eating (including decreased daily intake of saturated fats, cholesterol, added sugars, sodium),routine healthcare maintenance.   2. Prediabetes Labs reveal prediabetes with an A1c of 5.8.  An A1c of 6.5 is supportive of a diagnosis of type 2 diabetes mellitus.  Working on a low carbohydrate diet, exercise, weight loss is recommended in order to prevent progression to type 2 diabetes mellitus.  - POCT glycosylated hemoglobin (Hb A1C)  3. Encounter for lipid screening for  cardiovascular disease - LP+Non-HDL Cholesterol; Future  4. Screening for metabolic disorder - CMP14+EGFR; Future  5. Screening for iron deficiency anemia - CBC with Differential/Platelet; Future  6. Tension headache Advised on rest, increase fluid intake - Continue with OTC Tylenol  and NSAID which have been effective  7. Need for immunization against influenza - Flu vaccine trivalent PF, 6mos and older(Flulaval,Afluria,Fluarix,Fluzone)        Follow-up: Return in about 1 year (around 02/10/2025) for CPE/ Preventive Health Exam.       Corrina Sabin, MD, FAAFP. [facility] [place], [place] [phone]   02/11/2024, 5:07 PM

## 2024-02-11 NOTE — Patient Instructions (Signed)
 Mantenimiento de Radiographer, therapeutic en las mujeres Health Maintenance, Female Adoptar un estilo de vida saludable y recibir atencin preventiva son importantes para promover la salud y Counsellor. Consulte al mdico sobre: El esquema adecuado para hacerse pruebas y exmenes peridicos. Cosas que puede hacer por su cuenta para prevenir enfermedades y [place] sano. Qu debo saber sobre la dieta, el peso y el ejercicio? Consuma una dieta saludable  Consuma una dieta que incluya muchas verduras, frutas, productos lcteos con bajo contenido de [place] y Associate Professor. No consuma muchos alimentos ricos en grasas slidas, azcares agregados o sodio. Mantenga un peso saludable El ndice de masa muscular [facility]) se [place] para identificar problemas de [place]. Proporciona una estimacin de la grasa corporal basndose en el peso y la altura. Su mdico puede ayudarle a Engineer, site [facility] y a Personnel officer o Pharmacologist un peso saludable. Haga ejercicio con regularidad Haga ejercicio con regularidad. Esta es una de las prcticas ms importantes que puede hacer por su salud. La [employer] de los adultos deben seguir estas pautas: Education officer, environmental, al menos, 150 minutos de actividad fsica por semana. El ejercicio debe aumentar la frecuencia cardaca y Media planner transpirar (ejercicio de intensidad moderada). Hacer ejercicios de fortalecimiento por lo [employer] por semana. Agregue esto a su plan de ejercicio de intensidad moderada. Pase menos tiempo sentada. Incluso la actividad fsica ligera puede ser beneficiosa. Controle sus niveles de colesterol y lpidos en la sangre Comience a realizarse anlisis de lpidos y Oncologist en la sangre a los 20 aos y luego reptalos cada 5 aos. Hgase controlar los niveles de colesterol con mayor frecuencia si: Sus niveles de lpidos y colesterol son altos. Es mayor de 40 aos. Presenta un alto riesgo de padecer enfermedades cardacas. Qu debo saber sobre las pruebas de deteccin del  cncer? Segn su historia clnica y sus antecedentes familiares, es posible que deba realizarse pruebas de deteccin del cncer en diferentes edades. Esto puede incluir pruebas de deteccin de lo siguiente: Cncer de mama. Cncer de cuello uterino. Cncer colorrectal. Cncer de piel. Cncer de pulmn. Qu debo saber sobre la enfermedad cardaca, la diabetes y la hipertensin arterial? Presin arterial y enfermedad cardaca La hipertensin arterial causa enfermedades cardacas y [place] el riesgo de accidente cerebrovascular. Es ms probable que esto se manifieste en las personas que tienen lecturas de presin arterial alta o tienen sobrepeso. Hgase controlar la presin arterial: Cada 3 a 5 aos si tiene entre 18 y 50 aos. Todos los aos si es mayor de 40 aos. Diabetes Realcese exmenes de deteccin de la diabetes con regularidad. Este anlisis revisa el nivel de azcar en la sangre en [place]. Hgase las pruebas de deteccin: Cada tres aos despus de los 40 aos de edad si tiene un peso normal y un bajo riesgo de padecer diabetes. Con ms frecuencia y a partir de [place] edad inferior si tiene sobrepeso o un alto riesgo de padecer diabetes. Qu debo saber sobre la prevencin de infecciones? Hepatitis B Si tiene un riesgo ms alto de contraer hepatitis B, debe someterse a un examen de deteccin de este virus. Hable con el mdico para averiguar si tiene riesgo de contraer la infeccin por hepatitis B. Hepatitis C Se recomienda el anlisis a: [employer] 1945 y 1965. Todas las personas que tengan un riesgo de haber contrado hepatitis C. Enfermedades de transmisin sexual (ETS) Hgase las pruebas de Airline pilot de ITS, incluidas la gonorrea y la clamidia, si: Es sexualmente activa y es Adult nurse de 24  aos. Es mayor de 24 aos, y el mdico le informa que corre riesgo de tener este tipo de infecciones. La actividad sexual ha cambiado desde que le hicieron la ltima prueba de  deteccin y tiene un riesgo mayor de Warehouse manager clamidia o Copy. Pregntele al mdico si usted tiene riesgo. Pregntele al mdico si usted tiene un alto riesgo de Primary school teacher VIH. El mdico tambin puede recomendarle un medicamento recetado para ayudar a evitar la infeccin por el VIH. Si elige tomar medicamentos para prevenir el VIH, primero debe [employer] de deteccin del VIH. Luego debe hacerse anlisis cada 3 meses mientras est tomando los medicamentos. Embarazo Si est por dejar de Armed forces training and education officer (fase premenopusica) y usted puede quedar [place], busque asesoramiento antes de [place]. T[place] de 400 a 800 microgramos (mcg) de cido [employer] si [place]. Pida mtodos de control de la natalidad (anticonceptivos) si desea evitar un embarazo no deseado. Osteoporosis y [place] La osteoporosis es una enfermedad en la que los huesos pierden los minerales y la fuerza por el avance de la edad. El resultado pueden ser fracturas en los [place]. Si tiene 65 aos o ms, o si est en riesgo de sufrir osteoporosis y fracturas, pregunte a su mdico si debe: Hacerse pruebas de deteccin de prdida sea. Tomar un suplemento de calcio o de vitamina D para reducir el riesgo de fracturas. Recibir terapia de reemplazo hormonal (TRH) para tratar los sntomas de la menopausia. Siga estas indicaciones en su casa: Consumo de alcohol No beba alcohol si: Su mdico le indica no hacerlo. Est embarazada, puede estar embarazada o est tratando de [place]. Si bebe alcohol: Limite la cantidad que bebe a lo siguiente: De 0 a 1 bebida por da. Sepa cunta cantidad de alcohol hay en las bebidas que toma. En los [address], una medida equivale a una botella de cerveza de 12 oz (355 ml), un vaso de vino de 5 oz (148 ml) o un vaso de una bebida alcohlica de alta graduacin de 1 oz (44 ml). Estilo de vida No consuma ningn producto que contenga nicotina o tabaco. Estos  productos incluyen cigarrillos, tabaco para Theatre manager y aparatos de vapeo, como los Administrator, Civil Service. Si necesita ayuda para dejar de consumir estos productos, consulte al mdico. No consuma drogas. No comparta agujas. Solicite ayuda a su mdico si necesita apoyo o informacin para abandonar las drogas. Indicaciones generales Realcese los estudios de rutina de [address], dentales y de Wellsite geologist. Mantngase al da con las vacunas. Infrmele a su mdico si: Se siente deprimida con frecuencia. Alguna vez ha sido vctima de [place] o no se siente seguro en su casa. Resumen Adoptar un estilo de vida saludable y recibir atencin preventiva son importantes para promover la salud y Counsellor. Siga las instrucciones del mdico acerca de una dieta saludable, el ejercicio y la realizacin de pruebas o exmenes para Hotel manager. Siga las instrucciones del mdico con respecto al control del colesterol y la presin arterial. Esta informacin no tiene Theme park manager el consejo del mdico. Asegrese de hacerle al mdico cualquier pregunta que tenga. Document Revised: 08/25/2020 Document Reviewed: 08/25/2020 Elsevier Patient Education  2024 [employer].

## 2024-02-12 ENCOUNTER — Ambulatory Visit: Attending: Family Medicine

## 2024-02-12 DIAGNOSIS — Z13228 Encounter for screening for other metabolic disorders: Secondary | ICD-10-CM

## 2024-02-12 DIAGNOSIS — Z130 Encounter for screening for diseases of the blood and blood-forming organs and certain disorders involving the immune mechanism: Secondary | ICD-10-CM

## 2024-02-12 DIAGNOSIS — Z13220 Encounter for screening for lipoid disorders: Secondary | ICD-10-CM

## 2024-02-13 ENCOUNTER — Ambulatory Visit: Payer: Self-pay | Admitting: Family Medicine

## 2024-02-13 LAB — CBC WITH DIFFERENTIAL/PLATELET
Basophils Absolute: 0.1 x10E3/uL (ref 0.0–0.2)
Basos: 1 %
EOS (ABSOLUTE): 0.2 x10E3/uL (ref 0.0–0.4)
Eos: 2 %
Hematocrit: 37.6 % (ref 34.0–46.6)
Hemoglobin: 12.2 g/dL (ref 11.1–15.9)
Immature Grans (Abs): 0 x10E3/uL (ref 0.0–0.1)
Immature Granulocytes: 0 %
Lymphocytes Absolute: 2.2 x10E3/uL (ref 0.7–3.1)
Lymphs: 28 %
MCH: 28.8 pg (ref 26.6–33.0)
MCHC: 32.4 g/dL (ref 31.5–35.7)
MCV: 89 fL (ref 79–97)
Monocytes Absolute: 0.4 x10E3/uL (ref 0.1–0.9)
Monocytes: 5 %
Neutrophils Absolute: 4.9 x10E3/uL (ref 1.4–7.0)
Neutrophils: 64 %
Platelets: 341 x10E3/uL (ref 150–450)
RBC: 4.23 x10E6/uL (ref 3.77–5.28)
RDW: 13.9 % (ref 11.7–15.4)
WBC: 7.7 x10E3/uL (ref 3.4–10.8)

## 2024-02-13 LAB — CMP14+EGFR
ALT: 28 IU/L (ref 0–32)
AST: 19 IU/L (ref 0–40)
Albumin: 4.4 g/dL (ref 3.9–4.9)
Alkaline Phosphatase: 71 IU/L (ref 41–116)
BUN/Creatinine Ratio: 20 (ref 9–23)
BUN: 10 mg/dL (ref 6–20)
Bilirubin Total: 0.3 mg/dL (ref 0.0–1.2)
CO2: 23 mmol/L (ref 20–29)
Calcium: 9.6 mg/dL (ref 8.7–10.2)
Chloride: 101 mmol/L (ref 96–106)
Creatinine, Ser: 0.5 mg/dL — ABNORMAL LOW (ref 0.57–1.00)
Globulin, Total: 3 g/dL (ref 1.5–4.5)
Glucose: 96 mg/dL (ref 70–99)
Potassium: 4.5 mmol/L (ref 3.5–5.2)
Sodium: 137 mmol/L (ref 134–144)
Total Protein: 7.4 g/dL (ref 6.0–8.5)
eGFR: 126 mL/min/1.73 (ref >59)

## 2024-02-13 LAB — LP+NON-HDL CHOLESTEROL
Cholesterol, Total: 156 mg/dL (ref 100–199)
HDL: 56 mg/dL (ref >39)
LDL Chol Calc (NIH): 80 mg/dL (ref 0–99)
Total Non-HDL-Chol (LDL+VLDL): 100 mg/dL (ref 0–129)
Triglycerides: 110 mg/dL (ref 0–149)
VLDL Cholesterol Cal: 20 mg/dL (ref 5–40)

## 2025-02-10 ENCOUNTER — Ambulatory Visit: Admitting: Family Medicine
